# Patient Record
Sex: Female | Born: 1995 | Race: White | Hispanic: No | Marital: Married | Smoking: Former smoker
Health system: Southern US, Community
[De-identification: ages and names within clinical notes are randomized; demographics above are authoritative.]

## PROBLEM LIST (undated history)

## (undated) ENCOUNTER — Inpatient Hospital Stay (HOSPITAL_COMMUNITY): Payer: Self-pay

## (undated) DIAGNOSIS — F419 Anxiety disorder, unspecified: Secondary | ICD-10-CM

## (undated) HISTORY — PX: DILATION AND CURETTAGE OF UTERUS: SHX78

## (undated) HISTORY — PX: ADENOIDECTOMY: SUR15

## (undated) HISTORY — PX: TYMPANOSTOMY TUBE PLACEMENT: SHX32

---
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
## 2016-02-21 LAB — OB RESULTS CONSOLE GBS: STREP GROUP B AG: NEGATIVE

## 2016-03-19 ENCOUNTER — Inpatient Hospital Stay (HOSPITAL_COMMUNITY): Payer: Medicaid Other | Admitting: Anesthesiology

## 2016-03-19 ENCOUNTER — Inpatient Hospital Stay (HOSPITAL_COMMUNITY)
Admission: AD | Admit: 2016-03-19 | Discharge: 2016-03-19 | Disposition: A | Discharge status: Home / Self Care | Payer: Medicaid Other | Source: Ambulatory Visit | Attending: Obstetrics and Gynecology | Admitting: Obstetrics and Gynecology

## 2016-03-19 ENCOUNTER — Encounter (HOSPITAL_COMMUNITY): Payer: Self-pay

## 2016-03-19 ENCOUNTER — Inpatient Hospital Stay (HOSPITAL_COMMUNITY)
Admission: AD | Admit: 2016-03-19 | Discharge: 2016-03-22 | DRG: 775 | Disposition: A | Discharge status: Home / Self Care | Payer: Medicaid Other | Source: Ambulatory Visit | Attending: Obstetrics and Gynecology | Admitting: Obstetrics and Gynecology

## 2016-03-19 DIAGNOSIS — Z87891 Personal history of nicotine dependence: Secondary | ICD-10-CM

## 2016-03-19 DIAGNOSIS — K219 Gastro-esophageal reflux disease without esophagitis: Secondary | ICD-10-CM | POA: Diagnosis present

## 2016-03-19 DIAGNOSIS — Z3493 Encounter for supervision of normal pregnancy, unspecified, third trimester: Secondary | ICD-10-CM | POA: Insufficient documentation

## 2016-03-19 DIAGNOSIS — Z3483 Encounter for supervision of other normal pregnancy, third trimester: Secondary | ICD-10-CM | POA: Diagnosis present

## 2016-03-19 DIAGNOSIS — O9962 Diseases of the digestive system complicating childbirth: Secondary | ICD-10-CM | POA: Diagnosis present

## 2016-03-19 DIAGNOSIS — Z3A40 40 weeks gestation of pregnancy: Secondary | ICD-10-CM

## 2016-03-19 LAB — CBC
HCT: 33.8 % — ABNORMAL LOW (ref 36.0–46.0)
HEMOGLOBIN: 11.4 g/dL — AB (ref 12.0–15.0)
MCH: 29.5 pg (ref 26.0–34.0)
MCHC: 33.7 g/dL (ref 30.0–36.0)
MCV: 87.3 fL (ref 78.0–100.0)
PLATELETS: 136 10*3/uL — AB (ref 150–400)
RBC: 3.87 MIL/uL (ref 3.87–5.11)
RDW: 13.7 % (ref 11.5–15.5)
WBC: 14.2 10*3/uL — AB (ref 4.0–10.5)

## 2016-03-19 LAB — TYPE AND SCREEN
ABO/RH(D): O POS
Antibody Screen: NEGATIVE

## 2016-03-19 MED ORDER — OXYTOCIN BOLUS FROM INFUSION
500.0000 mL | Freq: Once | INTRAVENOUS | Status: AC
  Administered 2016-03-20: 500 mL via INTRAVENOUS

## 2016-03-19 MED ORDER — ACETAMINOPHEN 325 MG PO TABS: 650.0000 mg | ORAL_TABLET | ORAL | Status: DC | PRN

## 2016-03-19 MED ORDER — EPHEDRINE 5 MG/ML INJ
10.0000 mg | INTRAVENOUS | Status: DC | PRN
  Filled 2016-03-19: qty 4

## 2016-03-19 MED ORDER — FLEET ENEMA 7-19 GM/118ML RE ENEM: 1.0000 | ENEMA | RECTAL | Status: DC | PRN

## 2016-03-19 MED ORDER — LIDOCAINE HCL (PF) 1 % IJ SOLN
30.0000 mL | INTRAMUSCULAR | Status: DC | PRN
  Filled 2016-03-19: qty 30

## 2016-03-19 MED ORDER — LACTATED RINGERS IV SOLN: 500.0000 mL | INTRAVENOUS | Status: DC | PRN

## 2016-03-19 MED ORDER — OXYTOCIN 40 UNITS IN LACTATED RINGERS INFUSION - SIMPLE MED
2.5000 [IU]/h | INTRAVENOUS | Status: DC
  Filled 2016-03-19: qty 1000

## 2016-03-19 MED ORDER — OXYCODONE-ACETAMINOPHEN 5-325 MG PO TABS: 1.0000 | ORAL_TABLET | ORAL | Status: DC | PRN

## 2016-03-19 MED ORDER — ONDANSETRON HCL 4 MG/2ML IJ SOLN: 4.0000 mg | Freq: Four times a day (QID) | INTRAMUSCULAR | Status: DC | PRN

## 2016-03-19 MED ORDER — LACTATED RINGERS IV SOLN
500.0000 mL | Freq: Once | INTRAVENOUS | Status: AC
  Administered 2016-03-19: 500 mL via INTRAVENOUS

## 2016-03-19 MED ORDER — PHENYLEPHRINE 40 MCG/ML (10ML) SYRINGE FOR IV PUSH (FOR BLOOD PRESSURE SUPPORT)
80.0000 ug | PREFILLED_SYRINGE | INTRAVENOUS | Status: DC | PRN
Start: 2016-03-19 — End: 2016-03-20
  Filled 2016-03-19: qty 5

## 2016-03-19 MED ORDER — SOD CITRATE-CITRIC ACID 500-334 MG/5ML PO SOLN: 30.0000 mL | ORAL | Status: DC | PRN

## 2016-03-19 MED ORDER — OXYCODONE-ACETAMINOPHEN 5-325 MG PO TABS: 2.0000 | ORAL_TABLET | ORAL | Status: DC | PRN

## 2016-03-19 MED ORDER — PHENYLEPHRINE 40 MCG/ML (10ML) SYRINGE FOR IV PUSH (FOR BLOOD PRESSURE SUPPORT)
80.0000 ug | PREFILLED_SYRINGE | INTRAVENOUS | Status: DC | PRN
  Filled 2016-03-19: qty 5
  Filled 2016-03-19: qty 10

## 2016-03-19 MED ORDER — DIPHENHYDRAMINE HCL 50 MG/ML IJ SOLN
12.5000 mg | INTRAMUSCULAR | Status: DC | PRN
  Administered 2016-03-20: 12.5 mg via INTRAVENOUS
  Filled 2016-03-19: qty 1

## 2016-03-19 MED ORDER — FENTANYL 2.5 MCG/ML BUPIVACAINE 1/10 % EPIDURAL INFUSION (WH - ANES)
14.0000 mL/h | INTRAMUSCULAR | Status: DC | PRN
  Administered 2016-03-19 – 2016-03-20 (×2): 14 mL/h via EPIDURAL
  Filled 2016-03-19 (×2): qty 125

## 2016-03-19 MED ORDER — LACTATED RINGERS IV SOLN
INTRAVENOUS | Status: DC
  Administered 2016-03-19 – 2016-03-20 (×2): via INTRAVENOUS

## 2016-03-19 NOTE — Anesthesia Preprocedure Evaluation (Signed)
Anesthesia Evaluation  Patient identified by MRN, date of birth, ID band Patient awake    Reviewed: Allergy & Precautions, NPO status , Patient's Chart, lab work & pertinent test results  Airway Mallampati: II  TM Distance: >3 FB Neck ROM: Full    Dental no notable dental hx. (+) Teeth Intact   Pulmonary former smoker,    Pulmonary exam normal breath sounds clear to auscultation       Cardiovascular negative cardio ROS Normal cardiovascular exam Rhythm:Regular Rate:Normal     Neuro/Psych PSYCHIATRIC DISORDERS Anxiety negative neurological ROS     GI/Hepatic Neg liver ROS, GERD  Medicated and Controlled,  Endo/Other  negative endocrine ROS  Renal/GU negative Renal ROS  negative genitourinary   Musculoskeletal negative musculoskeletal ROS (+)   Abdominal   Peds negative pediatric ROS (+)  Hematology  (+) anemia , Thrombocytopenia-mild   Anesthesia Other Findings   Reproductive/Obstetrics (+) Pregnancy                               Lab Results  Component Value Date   WBC 14.2 (H) 03/19/2016   HGB 11.4 (L) 03/19/2016   HCT 33.8 (L) 03/19/2016   MCV 87.3 03/19/2016   PLT 136 (L) 03/19/2016    Anesthesia Physical  Anesthesia Plan  ASA: II  Anesthesia Plan: Epidural   Post-op Pain Management:    Induction: Intravenous  Airway Management Planned: Natural Airway  Additional Equipment:   Intra-op Plan:   Post-operative Plan:   Informed Consent: I have reviewed the patients History and Physical, chart, labs and discussed the procedure including the risks, benefits and alternatives for the proposed anesthesia with the patient or authorized representative who has indicated his/her understanding and acceptance.     Plan Discussed with: Anesthesiologist  Anesthesia Plan Comments:         Anesthesia Quick Evaluation

## 2016-03-19 NOTE — MAU Note (Signed)
Notified provider that patient is 3/90/-3. Provider said to admit patient to l&d.

## 2016-03-19 NOTE — MAU Note (Signed)
Contracting anywhere from 3-10 min.  No leaking, small amt of bleeding.  Was 1+cm last wk when checked.

## 2016-03-19 NOTE — Discharge Instructions (Signed)
Keep scheduled appt for prenatal care. Call the office or provider on call with further concerns or return to MAU as needed. °

## 2016-03-19 NOTE — MAU Note (Signed)
Notified provider that patient is here for a labor eval. Patient mad change from 1.5 cm to 3cm. Provider siad to recheck patient in another hour.

## 2016-03-19 NOTE — MAU Note (Signed)
Pt presents complaining of contractions. They have continued since being seen this am in MAU. Have not gotten worse but haven't stopped. Denies leaking. Reports some spotting when she wipes. Reports good fetal movement.

## 2016-03-20 ENCOUNTER — Encounter (HOSPITAL_COMMUNITY): Payer: Self-pay | Admitting: *Deleted

## 2016-03-20 LAB — RPR: RPR: NONREACTIVE

## 2016-03-20 MED ORDER — DIPHENHYDRAMINE HCL 25 MG PO CAPS: 25.0000 mg | ORAL_CAPSULE | Freq: Four times a day (QID) | ORAL | Status: DC | PRN

## 2016-03-20 MED ORDER — BENZOCAINE-MENTHOL 20-0.5 % EX AERO
1.0000 "application " | INHALATION_SPRAY | CUTANEOUS | Status: DC | PRN
  Administered 2016-03-20: 1 via TOPICAL
  Filled 2016-03-20: qty 56

## 2016-03-20 MED ORDER — PRENATAL MULTIVITAMIN CH
1.0000 | ORAL_TABLET | Freq: Every day | ORAL | Status: DC
  Administered 2016-03-21: 1 via ORAL
  Filled 2016-03-20: qty 1

## 2016-03-20 MED ORDER — ONDANSETRON HCL 4 MG PO TABS: 4.0000 mg | ORAL_TABLET | ORAL | Status: DC | PRN

## 2016-03-20 MED ORDER — METHYLERGONOVINE MALEATE 0.2 MG/ML IJ SOLN: 0.2000 mg | INTRAMUSCULAR | Status: DC | PRN

## 2016-03-20 MED ORDER — ONDANSETRON HCL 4 MG/2ML IJ SOLN: 4.0000 mg | INTRAMUSCULAR | Status: DC | PRN

## 2016-03-20 MED ORDER — MEASLES, MUMPS & RUBELLA VAC ~~LOC~~ INJ
0.5000 mL | INJECTION | Freq: Once | SUBCUTANEOUS | Status: DC
  Filled 2016-03-20: qty 0.5

## 2016-03-20 MED ORDER — SENNOSIDES-DOCUSATE SODIUM 8.6-50 MG PO TABS
2.0000 | ORAL_TABLET | ORAL | Status: DC
  Administered 2016-03-20 – 2016-03-21 (×2): 2 via ORAL
  Filled 2016-03-20 (×3): qty 2

## 2016-03-20 MED ORDER — COCONUT OIL OIL: 1.0000 "application " | TOPICAL_OIL | Status: DC | PRN

## 2016-03-20 MED ORDER — DIBUCAINE 1 % RE OINT: 1.0000 "application " | TOPICAL_OINTMENT | RECTAL | Status: DC | PRN

## 2016-03-20 MED ORDER — TETANUS-DIPHTH-ACELL PERTUSSIS 5-2.5-18.5 LF-MCG/0.5 IM SUSP: 0.5000 mL | Freq: Once | INTRAMUSCULAR | Status: DC

## 2016-03-20 MED ORDER — MAGNESIUM HYDROXIDE 400 MG/5ML PO SUSP: 30.0000 mL | ORAL | Status: DC | PRN

## 2016-03-20 MED ORDER — IBUPROFEN 600 MG PO TABS
600.0000 mg | ORAL_TABLET | Freq: Four times a day (QID) | ORAL | Status: DC
  Administered 2016-03-20 – 2016-03-22 (×7): 600 mg via ORAL
  Filled 2016-03-20 (×8): qty 1

## 2016-03-20 MED ORDER — ZOLPIDEM TARTRATE 5 MG PO TABS: 5.0000 mg | ORAL_TABLET | Freq: Every evening | ORAL | Status: DC | PRN

## 2016-03-20 MED ORDER — SIMETHICONE 80 MG PO CHEW
80.0000 mg | CHEWABLE_TABLET | ORAL | Status: DC | PRN
Start: 2016-03-20 — End: 2016-03-22

## 2016-03-20 MED ORDER — WITCH HAZEL-GLYCERIN EX PADS: 1.0000 "application " | MEDICATED_PAD | CUTANEOUS | Status: DC | PRN

## 2016-03-20 MED ORDER — LIDOCAINE HCL (PF) 1 % IJ SOLN
INTRAMUSCULAR | Status: DC | PRN
  Administered 2016-03-19 (×2): 4 mL via EPIDURAL

## 2016-03-20 MED ORDER — OXYCODONE HCL 5 MG PO TABS
5.0000 mg | ORAL_TABLET | ORAL | Status: DC | PRN
  Administered 2016-03-20: 5 mg via ORAL
  Filled 2016-03-20: qty 1

## 2016-03-20 MED ORDER — METHYLERGONOVINE MALEATE 0.2 MG PO TABS: 0.2000 mg | ORAL_TABLET | ORAL | Status: DC | PRN

## 2016-03-20 MED ORDER — OXYCODONE HCL 5 MG PO TABS: 10.0000 mg | ORAL_TABLET | ORAL | Status: DC | PRN

## 2016-03-20 MED ORDER — ACETAMINOPHEN 325 MG PO TABS: 650.0000 mg | ORAL_TABLET | ORAL | Status: DC | PRN

## 2016-03-20 NOTE — Progress Notes (Signed)
MOB was referred for history of depression/anxiety. * Referral screened out by Clinical Social Worker because none of the following criteria appear to apply: ~ History of anxiety/depression during this pregnancy, or of post-partum depression. ~ Diagnosis of anxiety and/or depression within last 3 years OR * MOB's symptoms currently being treated with medication and/or therapy. Please contact the Clinical Social Worker if needs arise, or if MOB requests.   

## 2016-03-20 NOTE — H&P (Signed)
Regina Schwartz is a 20 y.o. female G2P0010 at 72 week, presenting for 2nd time today with ctx.  +FM, no LOF, no VB, ctx - increasing in intensity/frequency.  Relatively uncomplicated PNC.  Dated by early [place].    OB History    Gravida Para Term Preterm AB Living   2       1     SAB TAB Ectopic Multiple Live Births   1            G1 SAB - D&C G2 present No abn pap No STD  Past Medical History:  Diagnosis Date  . Anxiety    Past Surgical History:  Procedure Laterality Date  . ADENOIDECTOMY  age 44  . DILATION AND EVACUATION N/A 04/19/2015   Procedure: DILATATION AND EVACUATION;  Surgeon: Regina Areola, DO;  Location: [facility];  Service: Gynecology;  Laterality: N/A;  . TYMPANOSTOMY TUBE PLACEMENT     Family History: family history includes Asthma in her brother; Drug abuse in her cousin; Migraines in her mother; Pancreatitis in her father; Seizures in her father. Social History:  reports that she quit smoking about 13 months ago. Her smoking use included Cigarettes. She has a 2.00 pack-year smoking history. She has never used smokeless tobacco. She reports that she does not drink alcohol or use drugs.   Meds PNV, prilosec All NKDA    Maternal Diabetes: No Genetic Screening: Normal Maternal Ultrasounds/Referrals: Normal Fetal Ultrasounds or other Referrals:  None Maternal Substance Abuse:  No Significant Maternal Medications:  None Significant Maternal Lab Results:  Lab values include: Group B Strep negative Other Comments:  None  Review of Systems  Constitutional: Negative.   HENT: Negative.   Eyes: Negative.   Respiratory: Negative.   Cardiovascular: Negative.   Gastrointestinal: Negative.   Genitourinary: Negative.   Musculoskeletal: Positive for back pain.  Skin: Negative.   Neurological: Negative.   Psychiatric/Behavioral: Negative.    Maternal Medical History:  Reason for admission: Contractions.   Contractions: Frequency: regular.   Perceived severity  is strong.    Fetal activity: Perceived fetal activity is normal.    Prenatal Complications - Diabetes: none.    Dilation: 3.5 Effacement (%): 90 Station: -2 Exam by:: Regina Popper, RN Blood pressure 123/71, pulse (!) 103, temperature 98.1 F (36.7 C), temperature source Oral, resp. rate 16, height 5\' 4"  (1.626 m), weight 62.6 kg (138 lb), last menstrual period 06/09/2015, SpO2 100 %, unknown if currently breastfeeding. Maternal Exam:  Uterine Assessment: Contraction frequency is regular.   Abdomen: Fundal height is appropriate for gestational.   Estimated fetal weight is 6-7#.   Fetal presentation: vertex  Introitus: Normal vulva. Normal vagina.  Cervix: Cervix evaluated by digital exam.     Physical Exam  Constitutional: She is oriented to person, place, and time. She appears well-developed and well-nourished.  HENT:  Head: Normocephalic and atraumatic.  Cardiovascular: Normal rate and regular rhythm.   Respiratory: Effort normal and breath sounds normal. No respiratory distress. She has no wheezes.  GI: Soft. Bowel sounds are normal. She exhibits no distension. There is no tenderness.  Musculoskeletal: Normal range of motion.  Neurological: She is alert and oriented to person, place, and time.  Skin: Skin is warm and dry.  Psychiatric: She has a normal mood and affect. Her behavior is normal.    Prenatal labs: ABO, Rh: --/--/O POS (10/18 2250) Antibody: NEG (10/18 2250) Rubella: Immune (04/10 0000) RPR: Nonreactive (04/10 0000)  HBsAg: Negative (04/10 0000)  HIV: Non-reactive (04/10 0000)  GBS: Negative (09/21 0000)   Dated by early US - EDC 03/20/2016 US cwd, ant plac, nl anat, female  Hgb 13.0/Plt 158/GC neg/Chl neg/Varicella immune/First trimester screen WNL/glucola 113/essential -panel neg - CF neg/ SMA neg/ Fragile X neg  Tdap 01/09/16  Assessment/Plan: 20yo G2P0010 at 40 week in early labor Augment with pitocin and AROM Epidural for pain Expect  SVD   Regina Schwartz, Regina Schwartz 03/20/2016, 1:46 AM

## 2016-03-20 NOTE — Anesthesia Procedure Notes (Signed)
Epidural Patient location during procedure: OB Start time: 03/19/2016 11:50 PM  Staffing Anesthesiologist: Mal AmabileFOSTER, Jaycelyn Orrison Performed: anesthesiologist   Preanesthetic Checklist Completed: patient identified, site marked, surgical consent, pre-op evaluation, timeout performed, IV checked, risks and benefits discussed and monitors and equipment checked  Epidural Patient position: sitting Prep: site prepped and draped and DuraPrep Patient monitoring: continuous pulse ox and blood pressure Approach: midline Location: L3-L4 Injection technique: LOR air  Needle:  Needle type: Tuohy  Needle gauge: 17 G Needle length: 9 cm and 9 Needle insertion depth: 4 cm Catheter type: closed end flexible Catheter size: 19 Gauge Catheter at skin depth: 9 cm Test dose: negative and Other  Assessment Events: blood not aspirated, injection not painful, no injection resistance, negative IV test and no paresthesia  Additional Notes Patient identified. Risks and benefits discussed including failed block, incomplete  Pain control, post dural puncture headache, nerve damage, paralysis, blood pressure Changes, nausea, vomiting, reactions to medications-both toxic and allergic and post Partum back pain. All questions were answered. Patient expressed understanding and wished to proceed. Sterile technique was used throughout procedure. Epidural site was Dressed with sterile barrier dressing. No paresthesias, signs of intravascular injection Or signs of intrathecal spread were encountered.  Patient was more comfortable after the epidural was dosed. Please see RN's note for documentation of vital signs and FHR which are stable.

## 2016-03-20 NOTE — Anesthesia Postprocedure Evaluation (Signed)
Anesthesia Post Note  Patient: Regina Schwartz  Procedure(s) Performed: * No procedures listed *  Patient location during evaluation: Mother Baby Anesthesia Type: Epidural Level of consciousness: awake and alert Pain management: satisfactory to patient Vital Signs Assessment: post-procedure vital signs reviewed and stable Respiratory status: respiratory function stable Cardiovascular status: stable Postop Assessment: no headache, no backache, epidural receding, patient able to bend at knees, no signs of nausea or vomiting and adequate PO intake Anesthetic complications: no     Last Vitals:  Vitals:   03/20/16 1330 03/20/16 1430  BP: 116/66 115/65  Pulse: 100 98  Resp: 20 19  Temp: 37 C 36.9 C    Last Pain:  Vitals:   03/20/16 1430  TempSrc: Oral  PainSc: 4    Pain Goal:                 [name]

## 2016-03-20 NOTE — Anesthesia Rounding Note (Signed)
  CRNA Epidural Rounding Note  Patient: Regina Schwartz, 20 y.o., female  Patient's current pain level: 2  Agreed upon pain management level: 4  Epidural intervention: No   Comments: patient comfortable [facility] 03/20/2016

## 2016-03-20 NOTE — Progress Notes (Signed)
Comfortable with epidural Afeb, VSS FHT- 150s, mod variability, non-repetitive variable decels, Cat II, ctx q 3 min VE-ant lip/C/+1, vtx Continue to monitor progress, anticipate SVD

## 2016-03-21 NOTE — Progress Notes (Signed)
Patient ID: Rutherford GuysHayley R Harts, female   DOB: 02/21/1996, 20 y.o.   MRN: [id number] PPD#1 Pt doing well. Pain controlled with ibuprofen. Lochia mild. No fever or chills. Tolerating Po. Voiding and ambulating well. Bonding well with baby; having some trouble getting baby to take to bottle.  VSS ABD - soft, ND, NTTP EXT- no homans  A/P: PPD#1 s/p svd - stable        Routine pp care        Discharge to home tomorrow

## 2016-03-22 MED ORDER — IBUPROFEN 600 MG PO TABS: 600.0000 mg | ORAL_TABLET | Freq: Four times a day (QID) | ORAL | 1 refills | Status: DC | PRN

## 2016-03-22 NOTE — Discharge Summary (Signed)
OB Discharge Summary     Patient Name: Regina Schwartz DOB: 11/24/1995 MRN: [id number]  Date of admission: 03/19/2016 Delivering MD: Jackelyn KnifeMEISINGER, TODD   Date of discharge: 03/22/2016  Admitting diagnosis: 39.6wks ctx Intrauterine pregnancy: 6079w0d     Secondary diagnosis:  Active Problems:   Indication for care in labor or delivery   SVD (spontaneous vaginal delivery)  Additional problems: none     Discharge diagnosis: Term Pregnancy Delivered                                                                                                Post partum procedures:none  Augmentation: AROM and Pitocin  Complications: None  Hospital course:  Onset of Labor With Vaginal Delivery     20 y.o. yo G2P1011 at 6279w0d was admitted in Latent Labor on 03/19/2016. Patient had an uncomplicated labor course as follows:  Membrane Rupture Time/Date: 12:30 AM ,03/20/2016   Intrapartum Procedures: Episiotomy: None [1]                                         Lacerations:  2nd degree [3]  Patient had a delivery of a Viable infant. 03/20/2016  Information for the patient's newborn:  Despina AriasSpencer, Girl Morrissa [[id number]][[id number]]  Delivery Method: Vag-Vacuum    Pateint had an uncomplicated postpartum course.  She is ambulating, tolerating a regular diet, passing flatus, and urinating well. Patient is discharged home in stable condition on 03/22/16.    Physical exam Vitals:   03/21/16 0505 03/21/16 1811 03/21/16 1930 03/22/16 0650  BP: 110/74 117/76 117/67 110/79  Pulse: 86 (!) 115 83 (!) 108  Resp: 18 18 18 20   Temp: 98.1 F (36.7 C)  97.8 F (36.6 C) 98 F (36.7 C)  TempSrc: Oral  Oral Oral  SpO2:   100%   Weight:      Height:       General: alert, cooperative and no distress Lochia: appropriate Uterine Fundus: firm Incision: N/A DVT Evaluation: No evidence of DVT seen on physical exam. Negative Homan's sign. Labs: Lab Results  Component Value Date   WBC 14.2 (H) 03/19/2016   HGB 11.4 (L)  03/19/2016   HCT 33.8 (L) 03/19/2016   MCV 87.3 03/19/2016   PLT 136 (L) 03/19/2016   CMP Latest Ref Rng & Units 04/02/2012  Glucose 70 - 99 mg/dL 82  BUN 6 - 23 mg/dL 15  Creatinine 6.290.10 - 5.281.20 mg/dL 4.130.71  Sodium 244135 - 010145 mEq/L 139  Potassium 3.5 - 5.3 mEq/L 3.8  Chloride 96 - 112 mEq/L 106  CO2 19 - 32 mEq/L 24  Calcium 8.4 - 10.5 mg/dL 9.8  Total Protein 6.0 - 8.3 g/dL 7.0  Total Bilirubin 0.3 - 1.2 mg/dL 0.6  Alkaline Phos 50 - 162 U/L 83  AST 0 - 37 U/L 17  ALT 0 - 35 U/L <8    Discharge instruction: per After Visit Summary and "Baby and Me Booklet".  After visit meds:    Medication List  TAKE these medications   ibuprofen 600 MG tablet Commonly known as:  ADVIL,MOTRIN Take 1 tablet (600 mg total) by mouth every 6 (six) hours as needed for headache, mild pain, moderate pain or cramping.   omeprazole 20 MG capsule Commonly known as:  PRILOSEC Take 20 mg by mouth daily as needed (for acid reflux).       Diet: routine diet  Activity: Advance as tolerated. Pelvic rest for 6 weeks.   Outpatient follow up:6 weeks Follow up Appt:No future appointments. Follow up Visit:No Follow-up on file.  Postpartum contraception: Undecided  Newborn Data: Live born female  Birth Weight: 6 lb 1.7 oz (2770 g) APGAR: 8, 9  Baby Feeding: Bottle Disposition:home with mother   03/22/2016 Sharol Given Mckay Brandt, DO

## 2016-03-22 NOTE — Discharge Instructions (Signed)
Nothing in vagina for 6 weeks.  No sex, tampons, and douching.  Other instructions as in [facility] Discharge Booklet. °

## 2016-03-22 NOTE — Progress Notes (Signed)
Patient ID: Regina Schwartz, female   DOB: 05/26/1996, 20 y.o.   MRN: [id number]  Pt doing well. Baby finally doing well bottlefeeding after given preemie nipple. No fever, chills or headaches. Lochia mild. Ready for discharge to home today VSS ABD-FF EXT- no homans  A/P: PPD#2 s/p svd - stable         Reviewed d/c instructions         F/u in 6weeks

## 2018-10-20 ENCOUNTER — Other Ambulatory Visit: Payer: Self-pay | Admitting: Nurse Practitioner

## 2018-10-20 DIAGNOSIS — R112 Nausea with vomiting, unspecified: Secondary | ICD-10-CM

## 2018-10-20 DIAGNOSIS — R1013 Epigastric pain: Secondary | ICD-10-CM

## 2018-10-27 ENCOUNTER — Ambulatory Visit
Admission: RE | Admit: 2018-10-27 | Discharge: 2018-10-27 | Disposition: A | Discharge status: Home / Self Care | Payer: Medicaid Other | Source: Ambulatory Visit | Attending: Nurse Practitioner | Admitting: Nurse Practitioner

## 2018-10-27 DIAGNOSIS — R112 Nausea with vomiting, unspecified: Secondary | ICD-10-CM

## 2018-10-27 DIAGNOSIS — R1013 Epigastric pain: Secondary | ICD-10-CM

## 2019-07-19 ENCOUNTER — Other Ambulatory Visit: Payer: Self-pay | Admitting: Family Medicine

## 2019-07-19 DIAGNOSIS — N6320 Unspecified lump in the left breast, unspecified quadrant: Secondary | ICD-10-CM

## 2019-08-08 ENCOUNTER — Other Ambulatory Visit: Payer: Self-pay

## 2019-08-08 ENCOUNTER — Other Ambulatory Visit: Payer: Self-pay | Admitting: Family Medicine

## 2019-08-08 ENCOUNTER — Ambulatory Visit
Admission: RE | Admit: 2019-08-08 | Discharge: 2019-08-08 | Disposition: A | Discharge status: Home / Self Care | Payer: Medicaid Other | Source: Ambulatory Visit | Attending: Family Medicine | Admitting: Family Medicine

## 2019-08-08 DIAGNOSIS — N6320 Unspecified lump in the left breast, unspecified quadrant: Secondary | ICD-10-CM

## 2020-02-13 ENCOUNTER — Other Ambulatory Visit: Payer: Self-pay | Admitting: Family Medicine

## 2020-02-13 ENCOUNTER — Ambulatory Visit
Admission: RE | Admit: 2020-02-13 | Discharge: 2020-02-13 | Disposition: A | Discharge status: Home / Self Care | Payer: Medicaid Other | Source: Ambulatory Visit | Attending: Family Medicine | Admitting: Family Medicine

## 2020-02-13 DIAGNOSIS — N6320 Unspecified lump in the left breast, unspecified quadrant: Secondary | ICD-10-CM

## 2020-03-23 ENCOUNTER — Ambulatory Visit
Admission: EM | Admit: 2020-03-23 | Discharge: 2020-03-23 | Disposition: A | Discharge status: Home / Self Care | Payer: Medicaid Other | Attending: Emergency Medicine | Admitting: Emergency Medicine

## 2020-03-23 DIAGNOSIS — M7750 Other enthesopathy of unspecified foot: Secondary | ICD-10-CM

## 2020-03-23 MED ORDER — NAPROXEN 500 MG PO TABS: 500.0000 mg | ORAL_TABLET | Freq: Two times a day (BID) | ORAL | 0 refills | Status: DC

## 2020-03-23 NOTE — ED Provider Notes (Signed)
[facility]    CSN: [id number] Arrival date & time: 03/23/20  1533      History   Chief Complaint Chief Complaint  Patient presents with  . Foot Pain    HPI Regina Schwartz is a 24 y.o. female  Presenting for left medial foot/heel pain x1 week.  States it is worse in the morning, or upon standing after sitting for long time.  States it will radiate up to her calf sometimes.  Denies injury, change in activity level, footwear.  Has tried 400 mg ibuprofen once daily without relief.  Past Medical History:  Diagnosis Date  . Anxiety     Patient Active Problem List   Diagnosis Date Noted  . SVD (spontaneous vaginal delivery) 03/20/2016  . Indication for care in labor or delivery 03/19/2016  . Abortion, missed 04/19/2015  . Malnourished (HCC) 03/03/2014  . Abnormal weight loss 03/03/2014  . Abdominal wall mass 03/03/2014  . Anxiety disorder 10/02/2011  . Dysmenorrhea 11/15/2010  . Menometrorrhagia 11/15/2010  . General counseling and advice on female contraception 11/15/2010  . ALLERGIC RHINITIS 03/15/2008    Past Surgical History:  Procedure Laterality Date  . ADENOIDECTOMY  age 61  . DILATION AND EVACUATION N/A 04/19/2015   Procedure: DILATATION AND EVACUATION;  Surgeon: Edwinna Areola, DO;  Location: [facility];  Service: Gynecology;  Laterality: N/A;  . TYMPANOSTOMY TUBE PLACEMENT      OB History    Gravida  2   Para  1   Term  1   Preterm      AB  1   Living  1     SAB  1   TAB      Ectopic      Multiple  0   Live Births  1            Home Medications    Prior to Admission medications   Medication Sig Start Date End Date Taking? Authorizing Provider  naproxen (NAPROSYN) 500 MG tablet Take 1 tablet (500 mg total) by mouth 2 (two) times daily. 03/23/20   Hall-Potvin, [place], PA-C    Family History Family History  Problem Relation Age of Onset  . Migraines Mother   . Seizures Father   . Pancreatitis Father   .  Asthma Brother   . Drug abuse Cousin     Social History Social History   Tobacco Use  . Smoking status: Former Smoker    Packs/day: 0.50    Years: 4.00    Pack years: 2.00    Types: Cigarettes    Quit date: 02/14/2015    Years since quitting: 5.1  . Smokeless tobacco: Never Used  Substance Use Topics  . Alcohol use: No  . Drug use: No     Allergies   Patient has no known allergies.   Review of Systems As per HPI   Physical Exam Triage Vital Signs ED Triage Vitals  Enc Vitals Group     BP      Pulse      Resp      Temp      Temp src      SpO2      Weight      Height      Head Circumference      Peak Flow      Pain Score      Pain Loc      Pain Edu?      Excl. in GC?  No data found.  Updated Vital Signs BP 111/73 (BP Location: Left Arm)   Pulse 73   Temp 98.4 F (36.9 C) (Oral)   Resp 20   LMP 03/02/2020   SpO2 96%   Breastfeeding No   Visual Acuity Right Eye Distance:   Left Eye Distance:   Bilateral Distance:    Right Eye Near:   Left Eye Near:    Bilateral Near:     Physical Exam Constitutional:      General: She is not in acute distress. HENT:     Head: Normocephalic and atraumatic.  Eyes:     General: No scleral icterus.    Pupils: Pupils are equal, round, and reactive to light.  Cardiovascular:     Rate and Rhythm: Normal rate.  Pulmonary:     Effort: Pulmonary effort is normal.  Musculoskeletal:        General: Tenderness present. No swelling. Normal range of motion.     Right lower leg: No edema.     Left lower leg: No edema.     Comments: Foot: No plantar tenderness.  Does have posterior tibialis tendon pain.  No Achilles tendon pain.  Negative Thompson test.  No medial or lateral malleoli tenderness.  NVI  Skin:    Coloration: Skin is not jaundiced or pale.     Findings: No bruising.  Neurological:     General: No focal deficit present.     Mental Status: She is alert and oriented to person, place, and time.       UC Treatments / Results  Labs (all labs ordered are listed, but only abnormal results are displayed) Labs Reviewed - No data to display  EKG   Radiology No results found.  Procedures Procedures (including critical care time)  Medications Ordered in UC Medications - No data to display  Initial Impression / Assessment and Plan / UC Course  I have reviewed the triage vital signs and the nursing notes.  Pertinent labs & imaging results that were available during my care of the patient were reviewed by me and considered in my medical decision making (see chart for details).     Reviewed supportive care of tendinitis as below.  Provided orthopedic contact information if needed.  Return precautions discussed, pt verbalized understanding and is agreeable to plan. Final Clinical Impressions(s) / UC Diagnoses   Final diagnoses:  Tendonitis of foot     Discharge Instructions     RICE: rest, ice, compression, elevation as needed for pain.    Pain medication:  500 mg Naprosyn/Aleve (naproxen) every 12 hours with food:  AVOID other NSAIDs while taking this (may have Tylenol).  Important to follow up with specialist(s) below for further evaluation/management if your symptoms persist or worsen.    ED Prescriptions    Medication Sig Dispense Auth. Provider   naproxen (NAPROSYN) 500 MG tablet Take 1 tablet (500 mg total) by mouth 2 (two) times daily. 30 tablet Hall-Potvin, [place], PA-C     PDMP not reviewed this encounter.   Hall-Potvin, [place], [place] 03/23/20 1655

## 2020-03-23 NOTE — ED Triage Notes (Signed)
Pt c/o pain to lt heel of foot x1wk. States now the pain is shooting up to lt calf.

## 2020-03-23 NOTE — Discharge Instructions (Addendum)
RICE: rest, ice, compression, elevation as needed for pain.    Pain medication:  500 mg Naprosyn/Aleve (naproxen) every 12 hours with food:  AVOID other NSAIDs while taking this (may have Tylenol).  Important to follow up with specialist(s) below for further evaluation/management if your symptoms persist or worsen. 

## 2020-06-02 NOTE — L&D Delivery Note (Signed)
Delivery Note Labor onset: 04/22/2021  Labor Onset Time: 0500 Complete dilation at 3:22 PM  Onset of pushing at 3:22 PM FHR second stage Cat 1 Analgesia/Anesthesia intrapartum: epidural  Guided pushing with maternal urge. Delivery of a viable female at 49. Fetal head delivered in ROA position.  Nuchal cord: loose x1.  Infant placed on maternal abd, dried, and tactile stim.  Cord double clamped after 2 minutes and cut by FOB.  Dr. Normand Sloop and RN present for birth.  Cord blood sample collected: yes Arterial cord blood sample collected: no  Placenta delivered Schultz, intact, with 3 VC.  Placenta to labor and delivery. Uterine tone firm, bleeding stable  2nd degree laceration identified.  Anesthesia: epidural Repair: 3-0 vycryl repaired in usual fashion QBL/EBL (mL): 200 Complications: none APGAR: APGAR (1 MIN): 9   APGAR (5 MINS): 9   APGAR (10 MINS):   Mom to postpartum.  Baby to Couplet care / Skin to Skin.  Oley Balm MSN, CNM 04/22/2021, 5:04 PM

## 2020-08-14 ENCOUNTER — Other Ambulatory Visit: Payer: Medicaid Other

## 2020-10-06 IMAGING — US US BREAST*L* LIMITED INC AXILLA
1 series · 7 of 7 positions shown · non-contrast
Comparison: None.

CLINICAL DATA: 23-year-old presenting with a palpable lump in UPPER
OUTER QUADRANT of the LEFT breast. She states that the lump is
mobile and is intermittently tender.

EXAM:
ULTRASOUND OF THE LEFT BREAST

[Series 1: us breast*left* limited inc axilla · 0.05mm/px · 7 of 7 slices shown]
[im 1/7]
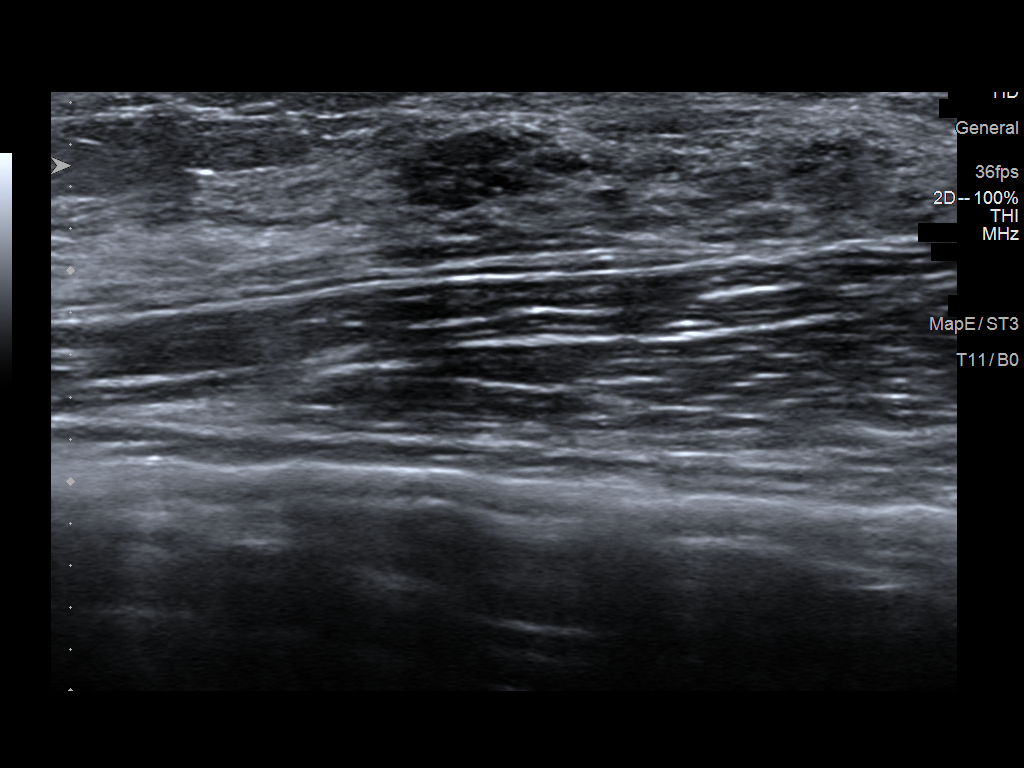
[im 2/7]
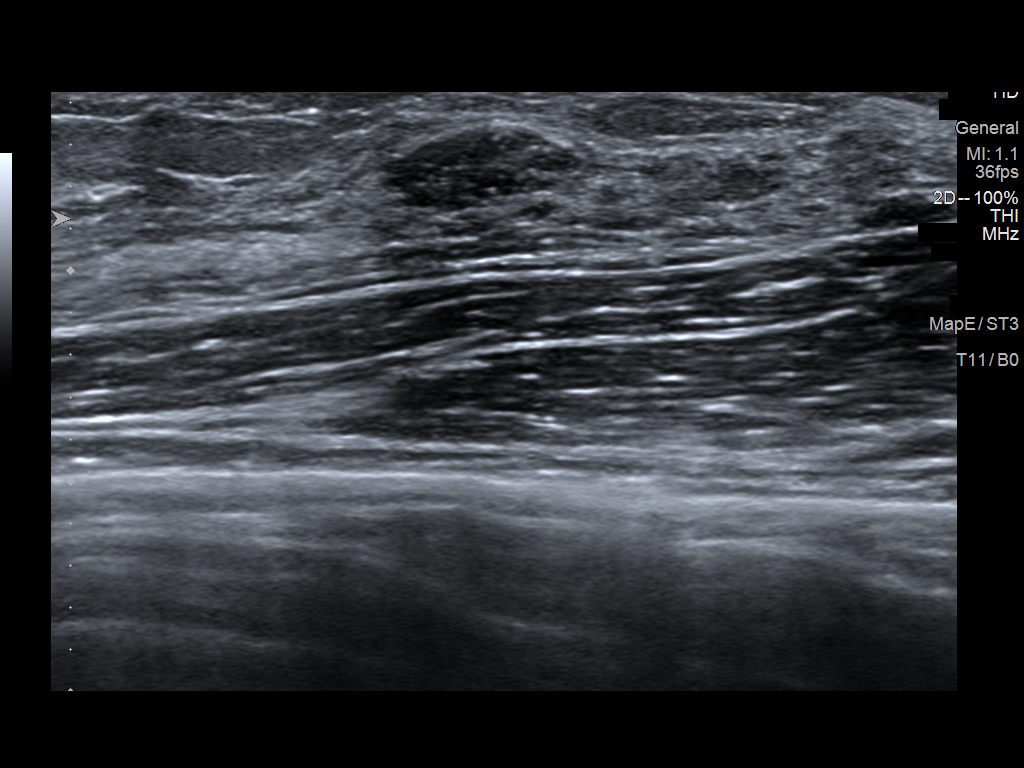
[im 3/7]
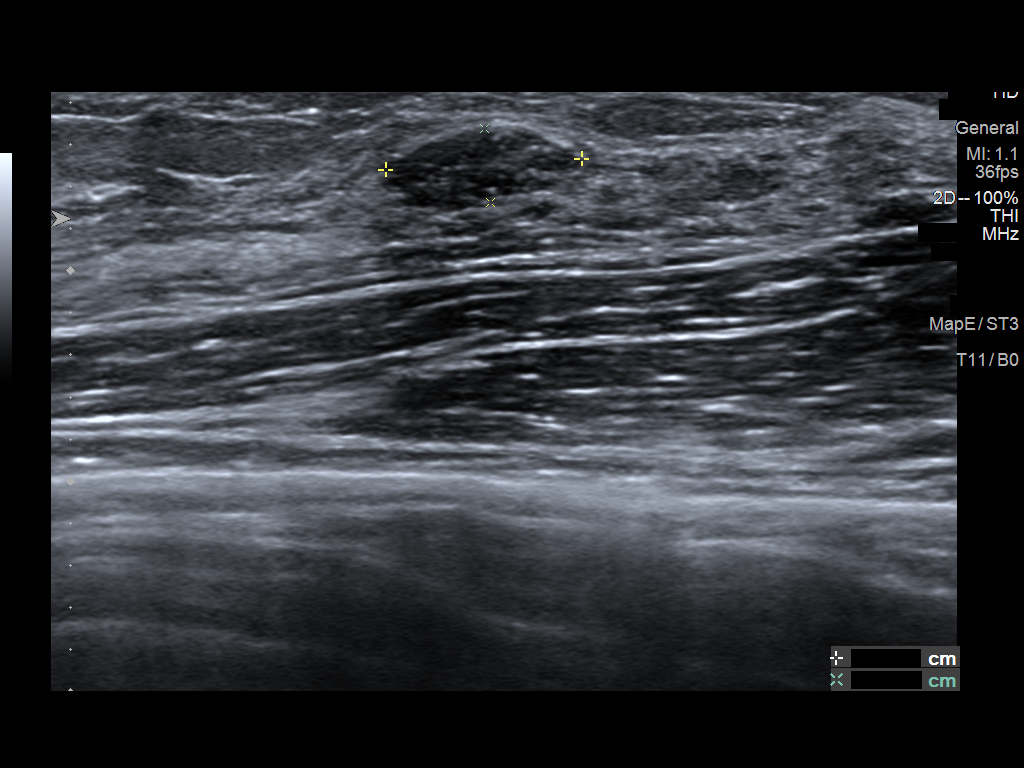
[im 4/7]
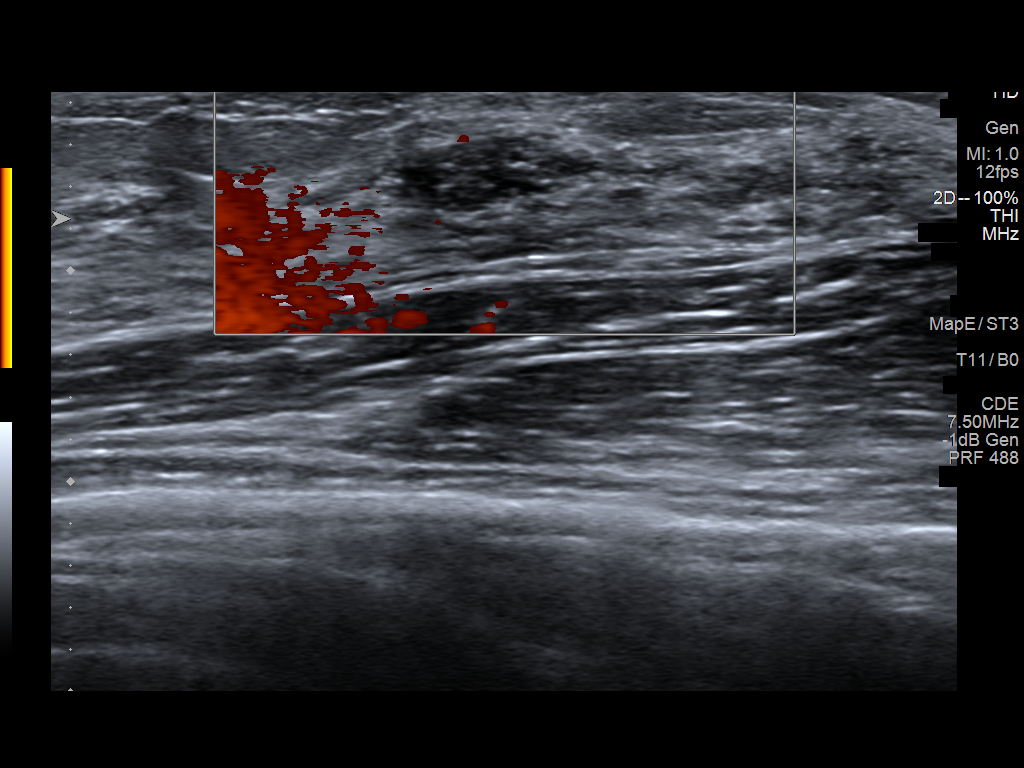
[im 5/7]
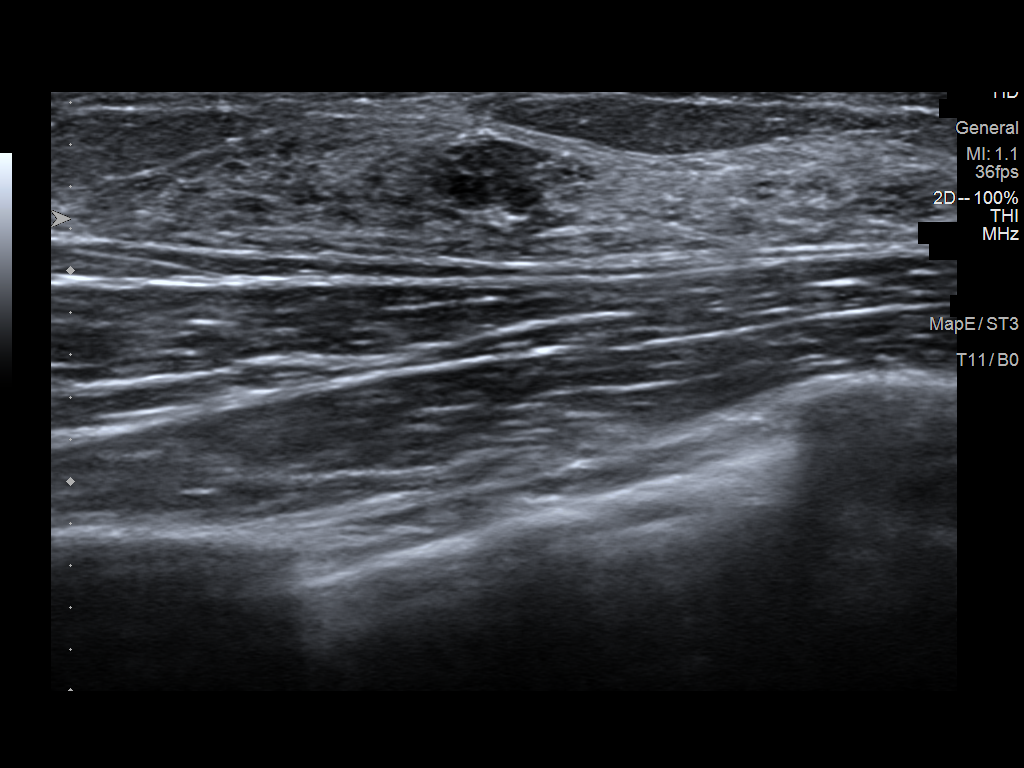
[im 6/7]
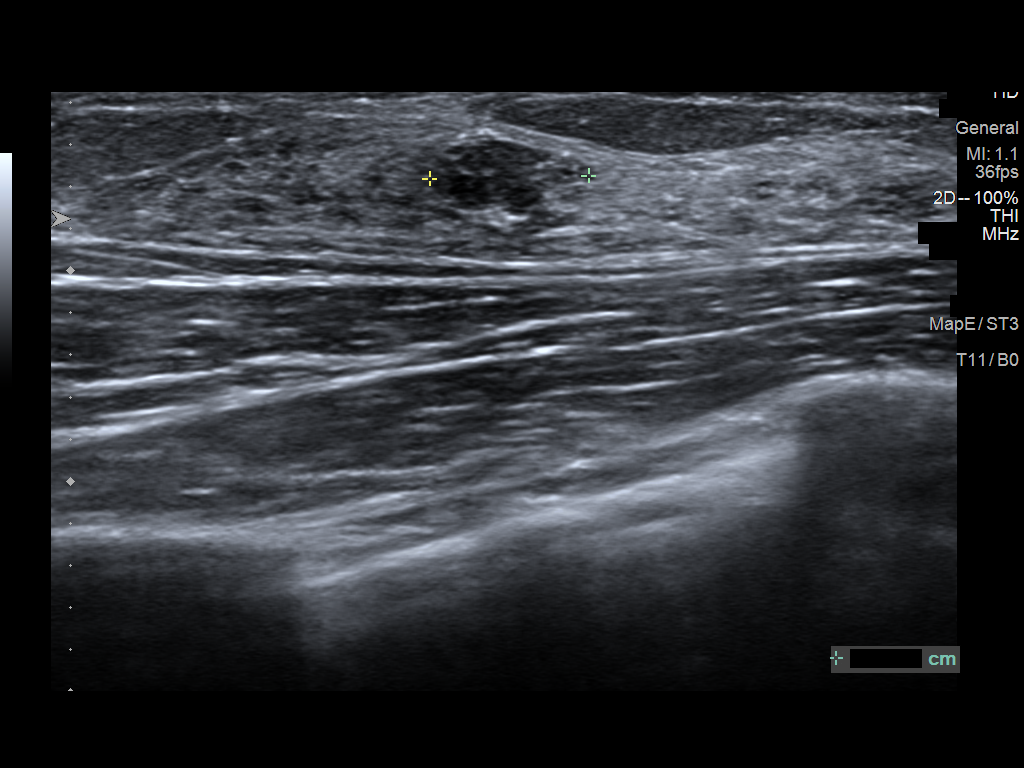
[im 7/7]
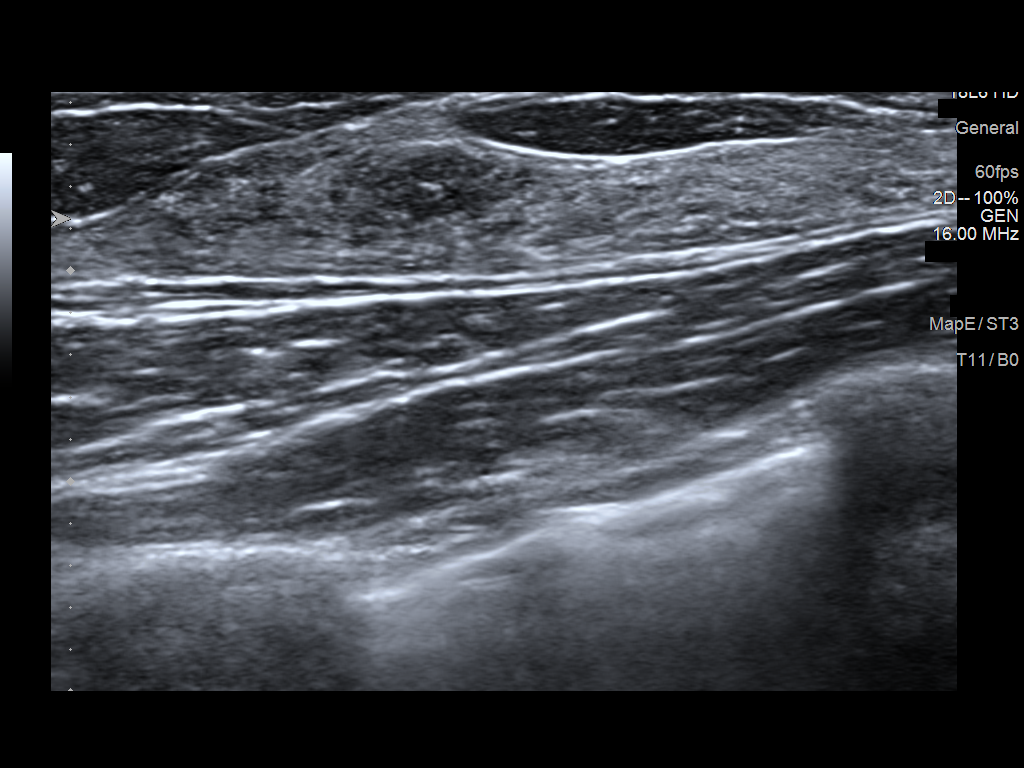

[7 of 7 positions shown; findings below may reference images not displayed]

FINDINGS: On correlative physical exam, there is a freely mobile palpable
approximate 1 cm mass in the UPPER OUTER LEFT breast corresponding
to what the patient is feeling

Targeted LEFT breast ultrasound is performed, showing an oval
circumscribed parallel hypoechoic mass at the 12:30 o'clock position
approximately 4 cm from nipple measuring approximately 4 x 9 x 8 mm,
demonstrating no posterior characteristics and demonstrating no
internal power Doppler flow, corresponding the area of palpable
concern
IMPRESSION: Likely benign 9 mm mass in the UPPER OUTER LEFT breast, possibly a
fibroadenoma, accounting for the palpable concern.

RECOMMENDATION:
We discussed management options for the likely benign fibroadenoma
including excision, ultrasound-guided core biopsy, and short term
interval follow-up. Follow-up ultrasound is recommended at 6, 12 and
24 months to assess stability. The patient agrees with this plan.

I have discussed the findings and recommendations with the patient.
If applicable, a reminder letter will be sent to the patient
regarding the next appointment.

BI-RADS CATEGORY  3: Probably benign.

## 2020-10-16 ENCOUNTER — Inpatient Hospital Stay (HOSPITAL_COMMUNITY)
Admission: AD | Admit: 2020-10-16 | Discharge: 2020-10-16 | Disposition: A | Discharge status: Home / Self Care | Payer: Medicaid Other | Attending: Obstetrics & Gynecology | Admitting: Obstetrics & Gynecology

## 2020-10-16 ENCOUNTER — Encounter (HOSPITAL_COMMUNITY): Payer: Self-pay | Admitting: Obstetrics & Gynecology

## 2020-10-16 ENCOUNTER — Other Ambulatory Visit: Payer: Self-pay

## 2020-10-16 DIAGNOSIS — Z87891 Personal history of nicotine dependence: Secondary | ICD-10-CM | POA: Insufficient documentation

## 2020-10-16 DIAGNOSIS — O26891 Other specified pregnancy related conditions, first trimester: Secondary | ICD-10-CM | POA: Diagnosis present

## 2020-10-16 DIAGNOSIS — Z3A13 13 weeks gestation of pregnancy: Secondary | ICD-10-CM | POA: Insufficient documentation

## 2020-10-16 DIAGNOSIS — R109 Unspecified abdominal pain: Secondary | ICD-10-CM | POA: Insufficient documentation

## 2020-10-16 LAB — URINALYSIS, ROUTINE W REFLEX MICROSCOPIC
Bilirubin Urine: NEGATIVE
Glucose, UA: NEGATIVE mg/dL
Hgb urine dipstick: NEGATIVE
Ketones, ur: 80 mg/dL — AB
Leukocytes,Ua: NEGATIVE
Nitrite: NEGATIVE
Protein, ur: NEGATIVE mg/dL
Specific Gravity, Urine: 1.019 (ref 1.005–1.030)
pH: 5 (ref 5.0–8.0)

## 2020-10-16 MED ORDER — ACETAMINOPHEN 325 MG PO TABS: 650.0000 mg | ORAL_TABLET | Freq: Four times a day (QID) | ORAL | 0 refills | Status: DC | PRN

## 2020-10-16 NOTE — MAU Provider Note (Signed)
History     CSN: [id number]  Arrival date and time: 10/16/20 1446   Event Date/Time   First Provider Initiated Contact with Patient 10/16/20 1533      Chief Complaint  Patient presents with  . Abdominal Cramping   HPI Regina Schwartz is a 25 y.o. G3P1011 at [redacted]w[redacted]d who presents to MAU with chief complaint of suprapubic pain. This is a recurrent problem, onset in early pregnancy. Patient's pain score is 5/10. Her pain does not radiate. She denies aggravating or alleviating factors. She has not taken medication or tried other treatments for this complaint. She verbalizes to CNM that she is leaving for her honeymoon tomorrow and just wants "one last check to make sure everything is ok".  Patient denies vaginal bleeding, abdominal tenderness, dysuria, fever, falls, or recent illness.   Patient receives care with [facility].  OB History    Gravida  3   Para  1   Term  1   Preterm      AB  1   Living  1     SAB  1   IAB      Ectopic      Multiple  0   Live Births  1           Past Medical History:  Diagnosis Date  . Anxiety     Past Surgical History:  Procedure Laterality Date  . ADENOIDECTOMY  age 61  . DILATION AND EVACUATION N/A 04/19/2015   Procedure: DILATATION AND EVACUATION;  Surgeon: Edwinna Areola, DO;  Location: [facility];  Service: Gynecology;  Laterality: N/A;  . TYMPANOSTOMY TUBE PLACEMENT      Family History  Problem Relation Age of Onset  . Migraines Mother   . Seizures Father   . Pancreatitis Father   . Asthma Brother   . Drug abuse Cousin     Social History   Tobacco Use  . Smoking status: Former Smoker    Packs/day: 0.50    Years: 4.00    Pack years: 2.00    Types: Cigarettes    Quit date: 02/14/2015    Years since quitting: 5.6  . Smokeless tobacco: Never Used  Vaping Use  . Vaping Use: Never used  Substance Use Topics  . Alcohol use: No  . Drug use: No    Allergies: No Known Allergies  Medications Prior to Admission   Medication Sig Dispense Refill Last Dose  . Prenatal Vit-Fe Fumarate-FA (PRENATAL MULTIVITAMIN) TABS tablet Take 1 tablet by mouth daily at 12 noon.   10/15/2020 at Unknown time  . naproxen (NAPROSYN) 500 MG tablet Take 1 tablet (500 mg total) by mouth 2 (two) times daily. 30 tablet 0     Review of Systems  Gastrointestinal: Positive for abdominal pain.  All other systems reviewed and are negative.  Physical Exam   Blood pressure 108/60, pulse 83, temperature 98.1 F (36.7 C), temperature source Oral, resp. rate 16, height 5\' 4"  (1.626 m), weight 57.3 kg, last menstrual period 03/02/2020, SpO2 100 %.  Physical Exam Vitals and nursing note reviewed.  Constitutional:      Appearance: Normal appearance. She is not ill-appearing.  Cardiovascular:     Rate and Rhythm: Normal rate.     Pulses: Normal pulses.  Pulmonary:     Effort: Pulmonary effort is normal.     Breath sounds: Normal breath sounds.  Abdominal:     Tenderness: There is no abdominal tenderness. There is no right CVA tenderness  or left CVA tenderness.  Skin:    Capillary Refill: Capillary refill takes less than 2 seconds.  Neurological:     Mental Status: She is alert and oriented to person, place, and time.  Psychiatric:        Mood and Affect: Mood normal.        Behavior: Behavior normal.        Thought Content: Thought content normal.        Judgment: Judgment normal.     MAU Course  Procedures   --Patient discharged prior to UA result. Called at home at 1700, identity confirmed with identifier x 2. Discussed finding of ketonuria, typical sources including caloric restriction, recurrent vomiting. Patient denies.   Patient Vitals for the past 24 hrs:  BP Temp Temp src Pulse Resp SpO2 Height Weight  10/16/20 1529 108/60 -- -- 83 -- -- -- --  10/16/20 1518 126/66 98.1 F (36.7 C) Oral (!) 110 16 100 % -- --  10/16/20 1513 -- -- -- -- -- -- 5\' 4"  (1.626 m) 57.3 kg   Results for orders placed or performed  during the hospital encounter of 10/16/20 (from the past 24 hour(s))  Urinalysis, Routine w reflex microscopic Urine, Clean Catch     Status: Abnormal   Collection Time: 10/16/20  3:18 PM  Result Value Ref Range   Color, Urine YELLOW YELLOW   APPearance CLEAR CLEAR   Specific Gravity, Urine 1.019 1.005 - 1.030   pH 5.0 5.0 - 8.0   Glucose, UA NEGATIVE NEGATIVE mg/dL   Hgb urine dipstick NEGATIVE NEGATIVE   Bilirubin Urine NEGATIVE NEGATIVE   Ketones, ur 80 (A) NEGATIVE mg/dL   Protein, ur NEGATIVE NEGATIVE mg/dL   Nitrite NEGATIVE NEGATIVE   Leukocytes,Ua NEGATIVE NEGATIVE   Meds ordered this encounter  Medications  . acetaminophen (TYLENOL) 325 MG tablet    Sig: Take 2 tablets (650 mg total) by mouth every 6 (six) hours as needed.    Dispense:  180 tablet    Refill:  0    Order Specific Question:   Supervising Provider    Answer:   10/18/20 Myna Hidalgo    Assessment and Plan  --25 y.o. G3P1011 at [redacted]w[redacted]d  --FHT 155 by Doppler --No acute findings on exam --Discharge home in stable condition  [redacted]w[redacted]d, CNM 10/16/2020, 7:19 PM

## 2020-10-16 NOTE — MAU Note (Incomplete)
Pt presents with abdominal cramping that began this morning. Denies vaginal bleeding or discharge.

## 2020-10-16 NOTE — Discharge Instructions (Signed)
Abdominal Pain During Pregnancy Abdominal pain is common during pregnancy and has many possible causes. Some causes are more serious than others, and sometimes the cause is not known. Abdominal pain can be a sign that labor is starting. It can also be caused by normal growth of your baby causing stretching of muscles and ligaments during pregnancy. Always tell your health care provider if you have any abdominal pain. Follow these instructions at home:  Do not have sex or put anything in your vagina until your pain goes away completely.  Get plenty of rest until your pain improves.  Drink enough fluid to keep your urine pale yellow.  Take over-the-counter and prescription medicines only as told by your health care provider.  Keep all follow-up visits. This is important.   Contact a health care provider if:  Your pain continues or gets worse after resting.  You have lower abdominal pain that: ? Comes and goes at regular intervals. ? Spreads to your back. ? Is similar to menstrual cramps.  You have pain or burning when you urinate. Get help right away if:  You have a fever, chills, or shortness of breath.  You have vaginal bleeding.  You are leaking fluid or passing tissue from your vagina.  You have vomiting or diarrhea that lasts for more than 24 hours.  Your baby is moving less than usual.  You feel very weak or faint.  You develop severe pain in your upper abdomen. Summary  Abdominal pain is common during pregnancy and has many possible causes.  If you experience abdominal pain during pregnancy, tell your health care provider right away.  Follow your health care provider's home care instructions and keep all follow-up visits as told. This information is not intended to replace advice given to you by your health care provider. Make sure you discuss any questions you have with your health care provider. Document Revised: 01/31/2020 Document Reviewed: 01/31/2020 Elsevier  Patient Education  2021 [employer]vier Inc.  

## 2020-10-22 LAB — OB RESULTS CONSOLE HEPATITIS B SURFACE ANTIGEN: Hepatitis B Surface Ag: NEGATIVE

## 2020-10-22 LAB — OB RESULTS CONSOLE RUBELLA ANTIBODY, IGM: Rubella: IMMUNE

## 2020-10-22 LAB — OB RESULTS CONSOLE RPR: RPR: NONREACTIVE

## 2020-10-30 LAB — HEPATITIS C ANTIBODY: HCV Ab: NEGATIVE

## 2020-11-09 ENCOUNTER — Inpatient Hospital Stay (HOSPITAL_COMMUNITY)
Admission: AD | Admit: 2020-11-09 | Discharge: 2020-11-09 | Disposition: A | Discharge status: Home / Self Care | Payer: Medicaid Other | Attending: Obstetrics and Gynecology | Admitting: Obstetrics and Gynecology

## 2020-11-09 ENCOUNTER — Inpatient Hospital Stay (HOSPITAL_BASED_OUTPATIENT_CLINIC_OR_DEPARTMENT_OTHER): Payer: Medicaid Other

## 2020-11-09 ENCOUNTER — Other Ambulatory Visit: Payer: Self-pay

## 2020-11-09 DIAGNOSIS — D72829 Elevated white blood cell count, unspecified: Secondary | ICD-10-CM | POA: Insufficient documentation

## 2020-11-09 DIAGNOSIS — O26892 Other specified pregnancy related conditions, second trimester: Secondary | ICD-10-CM | POA: Insufficient documentation

## 2020-11-09 DIAGNOSIS — O99012 Anemia complicating pregnancy, second trimester: Secondary | ICD-10-CM | POA: Diagnosis not present

## 2020-11-09 DIAGNOSIS — R109 Unspecified abdominal pain: Secondary | ICD-10-CM | POA: Insufficient documentation

## 2020-11-09 DIAGNOSIS — O99112 Other diseases of the blood and blood-forming organs and certain disorders involving the immune mechanism complicating pregnancy, second trimester: Secondary | ICD-10-CM | POA: Insufficient documentation

## 2020-11-09 DIAGNOSIS — Z3A16 16 weeks gestation of pregnancy: Secondary | ICD-10-CM | POA: Insufficient documentation

## 2020-11-09 LAB — CBC WITH DIFFERENTIAL/PLATELET
Abs Immature Granulocytes: 0.09 10*3/uL — ABNORMAL HIGH (ref 0.00–0.07)
Basophils Absolute: 0 10*3/uL (ref 0.0–0.1)
Basophils Relative: 0 %
Eosinophils Absolute: 0 10*3/uL (ref 0.0–0.5)
Eosinophils Relative: 0 %
HCT: 34.2 % — ABNORMAL LOW (ref 36.0–46.0)
Hemoglobin: 11.9 g/dL — ABNORMAL LOW (ref 12.0–15.0)
Immature Granulocytes: 1 %
Lymphocytes Relative: 7 %
Lymphs Abs: 1 10*3/uL (ref 0.7–4.0)
MCH: 31.9 pg (ref 26.0–34.0)
MCHC: 34.8 g/dL (ref 30.0–36.0)
MCV: 91.7 fL (ref 80.0–100.0)
Monocytes Absolute: 0.7 10*3/uL (ref 0.1–1.0)
Monocytes Relative: 5 %
Neutro Abs: 12.6 10*3/uL — ABNORMAL HIGH (ref 1.7–7.7)
Neutrophils Relative %: 87 %
Platelets: 150 10*3/uL (ref 150–400)
RBC: 3.73 MIL/uL — ABNORMAL LOW (ref 3.87–5.11)
RDW: 11.7 % (ref 11.5–15.5)
WBC: 14.4 10*3/uL — ABNORMAL HIGH (ref 4.0–10.5)
nRBC: 0 % (ref 0.0–0.2)

## 2020-11-09 LAB — COMPREHENSIVE METABOLIC PANEL
ALT: 8 U/L (ref 0–44)
AST: 17 U/L (ref 15–41)
Albumin: 3.5 g/dL (ref 3.5–5.0)
Alkaline Phosphatase: 44 U/L (ref 38–126)
Anion gap: 10 (ref 5–15)
BUN: 7 mg/dL (ref 6–20)
CO2: 19 mmol/L — ABNORMAL LOW (ref 22–32)
Calcium: 8.8 mg/dL — ABNORMAL LOW (ref 8.9–10.3)
Chloride: 105 mmol/L (ref 98–111)
Creatinine, Ser: 0.46 mg/dL (ref 0.44–1.00)
GFR, Estimated: >60 mL/min (ref >60)
Glucose, Bld: 80 mg/dL (ref 70–99)
Potassium: 3.3 mmol/L — ABNORMAL LOW (ref 3.5–5.1)
Sodium: 134 mmol/L — ABNORMAL LOW (ref 135–145)
Total Bilirubin: 0.3 mg/dL (ref 0.3–1.2)
Total Protein: 6.2 g/dL — ABNORMAL LOW (ref 6.5–8.1)

## 2020-11-09 LAB — URINALYSIS, ROUTINE W REFLEX MICROSCOPIC
Bilirubin Urine: NEGATIVE
Glucose, UA: NEGATIVE mg/dL
Hgb urine dipstick: NEGATIVE
Ketones, ur: 20 mg/dL — AB
Nitrite: NEGATIVE
Protein, ur: NEGATIVE mg/dL
Specific Gravity, Urine: 1.012 (ref 1.005–1.030)
pH: 6 (ref 5.0–8.0)

## 2020-11-09 NOTE — MAU Provider Note (Signed)
History     CSN: [id number]  Arrival date and time: 11/09/20 1431   Event Date/Time   First Provider Initiated Contact with Patient 11/09/20 1603      Chief Complaint  Patient presents with   Abdominal Pain   HPI  Ms.Regina Schwartz is a 25 y.o. female G72P1011 @ [redacted]w[redacted]d here with acute onset of abdominal pain. The pain started at 1300 today. The pain is constant. Nothing makes it better, nothing makes it worsened. She currently rates her pain 6/10.  She took tylenol at 1330; which helped some. She has no fever, no nausea or vomiting.   OB History     Gravida  3   Para  1   Term  1   Preterm      AB  1   Living  1      SAB  1   IAB      Ectopic      Multiple  0   Live Births  1           Past Medical History:  Diagnosis Date   Anxiety     Past Surgical History:  Procedure Laterality Date   ADENOIDECTOMY  age 62   DILATION AND EVACUATION N/A 04/19/2015   Procedure: DILATATION AND EVACUATION;  Surgeon: Edwinna Areola, DO;  Location: [facility];  Service: Gynecology;  Laterality: N/A;   TYMPANOSTOMY TUBE PLACEMENT      Family History  Problem Relation Age of Onset   Migraines Mother    Seizures Father    Pancreatitis Father    Asthma Brother    Drug abuse Cousin     Social History   Tobacco Use   Smoking status: Former    Packs/day: 0.50    Years: 4.00    Pack years: 2.00    Types: Cigarettes    Quit date: 02/14/2015    Years since quitting: 5.7   Smokeless tobacco: Never  Vaping Use   Vaping Use: Never used  Substance Use Topics   Alcohol use: No   Drug use: No    Allergies: No Known Allergies  Medications Prior to Admission  Medication Sig Dispense Refill Last Dose   acetaminophen (TYLENOL) 325 MG tablet Take 2 tablets (650 mg total) by mouth every 6 (six) hours as needed. 180 tablet 0 11/09/2020   Prenatal Vit-Fe Fumarate-FA (PRENATAL MULTIVITAMIN) TABS tablet Take 1 tablet by mouth daily at 12 noon.   11/09/2020   Results  for orders placed or performed during the hospital encounter of 11/09/20 (from the past 48 hour(s))  Urinalysis, Routine w reflex microscopic Urine, Clean Catch     Status: Abnormal   Collection Time: 11/09/20  3:59 PM  Result Value Ref Range   Color, Urine YELLOW YELLOW   APPearance HAZY (A) CLEAR   Specific Gravity, Urine 1.012 1.005 - 1.030   pH 6.0 5.0 - 8.0   Glucose, UA NEGATIVE NEGATIVE mg/dL   Hgb urine dipstick NEGATIVE NEGATIVE   Bilirubin Urine NEGATIVE NEGATIVE   Ketones, ur 20 (A) NEGATIVE mg/dL   Protein, ur NEGATIVE NEGATIVE mg/dL   Nitrite NEGATIVE NEGATIVE   Leukocytes,Ua TRACE (A) NEGATIVE   RBC / HPF 0-5 0 - 5 RBC/hpf   WBC, UA 0-5 0 - 5 WBC/hpf   Bacteria, UA RARE (A) NONE SEEN   Squamous Epithelial / LPF 11-20 0 - 5    Comment: Performed at [facility], [address], [place], [place] [address]  CBC with Differential/Platelet     Status: Abnormal   Collection Time: 11/09/20  4:18 PM  Result Value Ref Range   WBC 14.4 (H) 4.0 - 10.5 K/uL   RBC 3.73 (L) 3.87 - 5.11 MIL/uL   Hemoglobin 11.9 (L) 12.0 - 15.0 g/dL   HCT 22.0 (L) 25.4 - 27.0 %   MCV 91.7 80.0 - 100.0 fL   MCH 31.9 26.0 - 34.0 pg   MCHC 34.8 30.0 - 36.0 g/dL   RDW 62.3 76.2 - 83.1 %   Platelets 150 150 - 400 K/uL   nRBC 0.0 0.0 - 0.2 %   Neutrophils Relative % 87 %   Neutro Abs 12.6 (H) 1.7 - 7.7 K/uL   Lymphocytes Relative 7 %   Lymphs Abs 1.0 0.7 - 4.0 K/uL   Monocytes Relative 5 %   Monocytes Absolute 0.7 0.1 - 1.0 K/uL   Eosinophils Relative 0 %   Eosinophils Absolute 0.0 0.0 - 0.5 K/uL   Basophils Relative 0 %   Basophils Absolute 0.0 0.0 - 0.1 K/uL   Immature Granulocytes 1 %   Abs Immature Granulocytes 0.09 (H) 0.00 - 0.07 K/uL    Comment: Performed at [facility], [address], [place], [place] [address]  Comprehensive metabolic panel     Status: Abnormal   Collection Time: 11/09/20  4:18 PM  Result Value Ref Range   Sodium 134 (L) 135 - 145 mmol/L   Potassium  3.3 (L) 3.5 - 5.1 mmol/L   Chloride 105 98 - 111 mmol/L   CO2 19 (L) 22 - 32 mmol/L   Glucose, Bld 80 70 - 99 mg/dL    Comment: Glucose reference range applies only to samples taken after fasting for at least 8 hours.   BUN 7 6 - 20 mg/dL   Creatinine, Ser 6.07 0.44 - 1.00 mg/dL   Calcium 8.8 (L) 8.9 - 10.3 mg/dL   Total Protein 6.2 (L) 6.5 - 8.1 g/dL   Albumin 3.5 3.5 - 5.0 g/dL   AST 17 15 - 41 U/L   ALT 8 0 - 44 U/L   Alkaline Phosphatase 44 38 - 126 U/L   Total Bilirubin 0.3 0.3 - 1.2 mg/dL   GFR, Estimated >37 >10 mL/min    Comment: (NOTE) Calculated using the CKD-EPI Creatinine Equation (2021)    Anion gap 10 5 - 15    Comment: Performed at [facility], [address] [place], [place] [address]    No results found.   Review of Systems  Constitutional:  Negative for fever.  Gastrointestinal:  Negative for constipation, diarrhea, nausea and vomiting.  Genitourinary:  Negative for dysuria, vaginal bleeding and vaginal discharge.  Physical Exam   Blood pressure (!) 117/57, pulse 90, temperature 98 F (36.7 C), temperature source Oral, resp. rate 17, height 5\' 4"  (1.626 m), weight 59.4 kg, last menstrual period 03/02/2020, SpO2 100 %.  Physical Exam Vitals and nursing note reviewed.  Constitutional:      General: She is not in acute distress.    Appearance: She is well-developed. She is not ill-appearing or toxic-appearing.  Abdominal:     General: Abdomen is flat.     Palpations: Abdomen is soft.     Tenderness: There is generalized abdominal tenderness. There is no guarding or rebound.  Neurological:     Mental Status: She is alert.   MAU Course  Procedures None  MDM  OB limited 05/02/2020 normal with normal cervical length: >  4 cm Leukocytosis however no fever, no nausea or vomiting, no rebound tenderness on exam:  Pain now 3/10- without treatment.  Assessment and Plan   A:  1. [redacted] weeks gestation of pregnancy   2. Abdominal cramping affecting pregnancy    3. Leukocytosis, unspecified type      P:  Discharge home in stable condition Strict return precautions Reviewed signs and symptoms of acute appendicitis.  Increase oral fluids  [place] Basham, Harolyn Rutherford, NP 11/09/2020 6:51 PM

## 2020-11-09 NOTE — MAU Note (Signed)
Around 1300, sudden onset of rt sided pain. "it just hurts. Never felt like this, but ain't went away or got worse"

## 2020-11-10 DIAGNOSIS — O26892 Other specified pregnancy related conditions, second trimester: Secondary | ICD-10-CM | POA: Diagnosis not present

## 2020-11-10 DIAGNOSIS — R109 Unspecified abdominal pain: Secondary | ICD-10-CM | POA: Diagnosis not present

## 2020-11-10 DIAGNOSIS — Z3A16 16 weeks gestation of pregnancy: Secondary | ICD-10-CM

## 2021-01-22 LAB — OB RESULTS CONSOLE HIV ANTIBODY (ROUTINE TESTING): HIV: NONREACTIVE

## 2021-03-28 LAB — OB RESULTS CONSOLE GC/CHLAMYDIA: Gonorrhea: NEGATIVE

## 2021-04-04 LAB — OB RESULTS CONSOLE GBS: GBS: NEGATIVE

## 2021-04-13 IMAGING — US US BREAST*L* LIMITED INC AXILLA
1 series · 6 of 6 positions shown · non-contrast
Comparison: Previous exam(s).

CLINICAL DATA: 24-year-old female presenting for follow-up of a
likely benign left breast mass.

EXAM:
ULTRASOUND OF THE LEFT BREAST

[Series 1: us breast*left* limited inc axilla · 0.06mm/px · 6 of 6 slices shown]
[im 1/6]
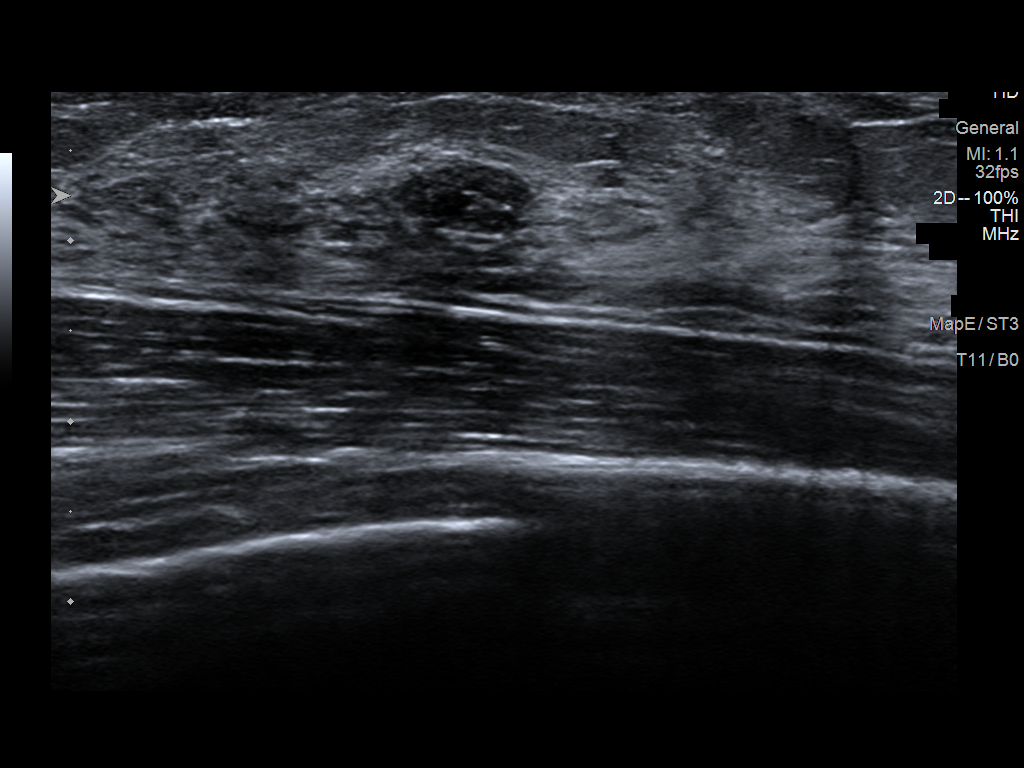
[im 2/6]
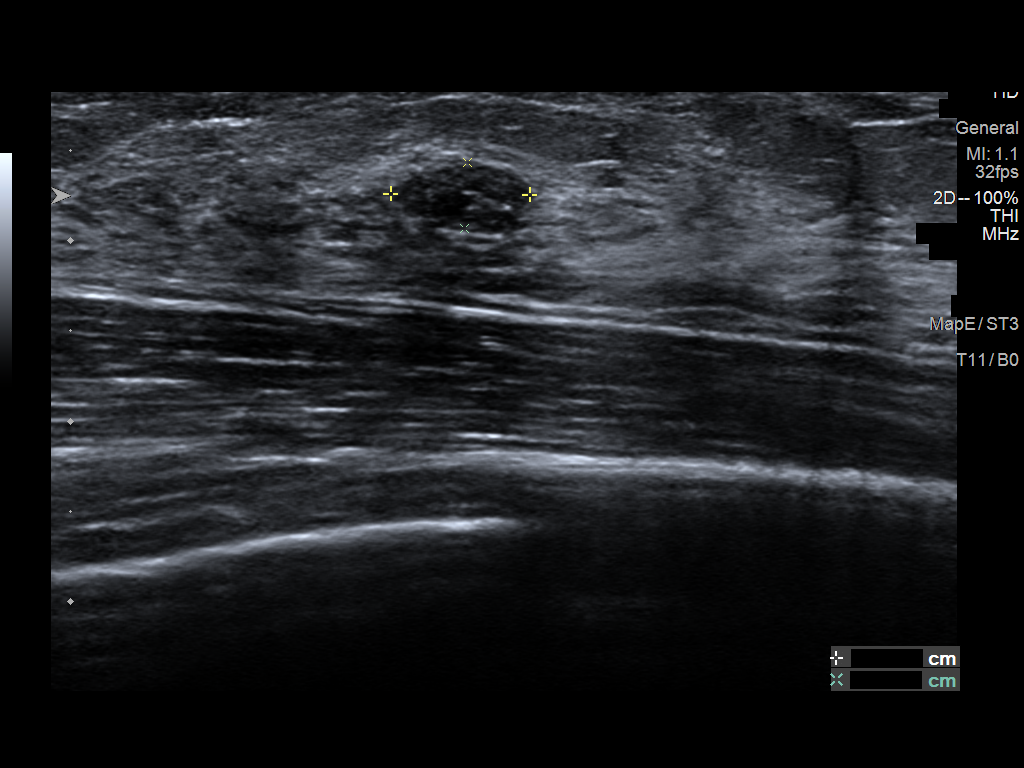
[im 3/6]
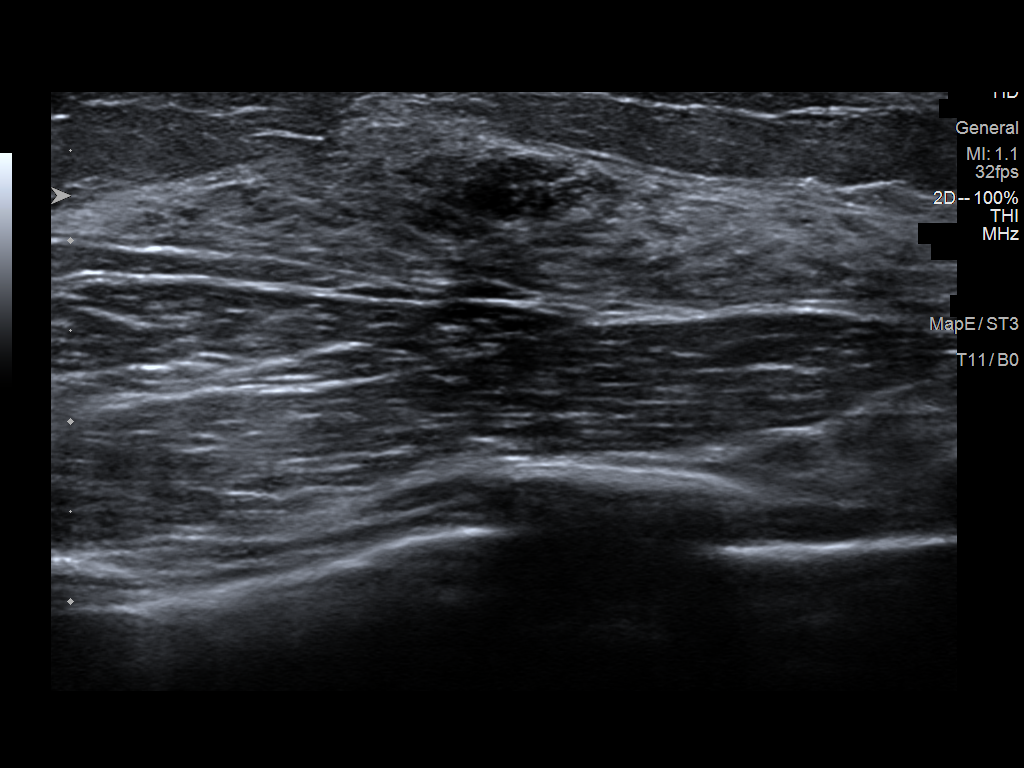
[im 4/6]
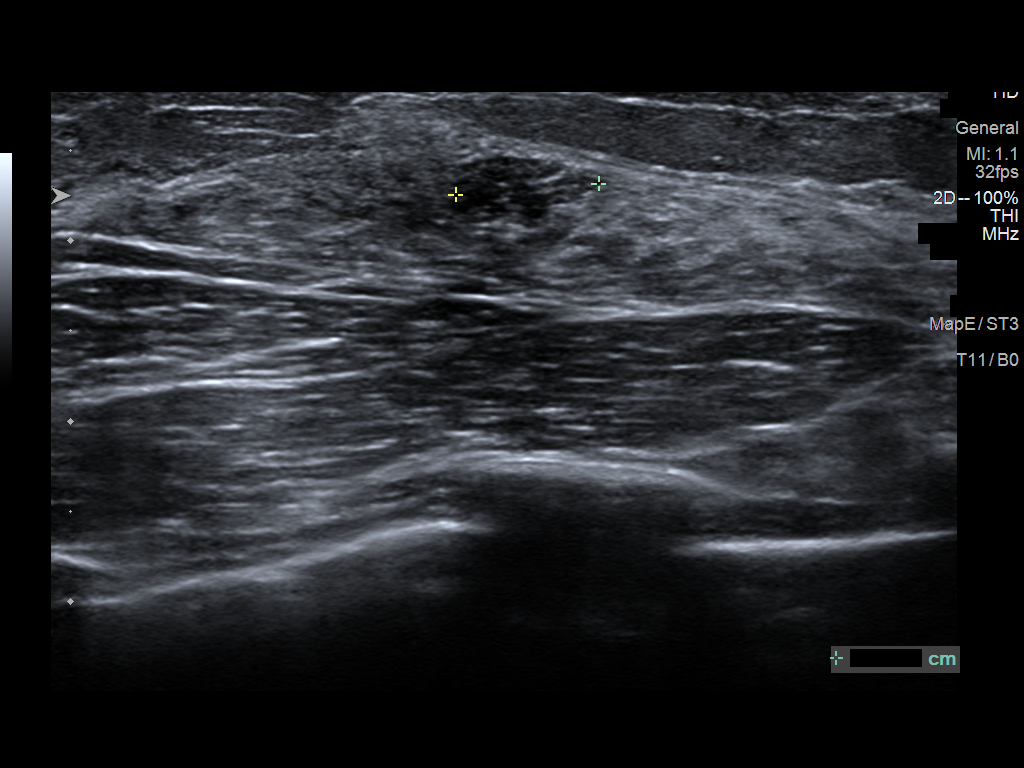
[im 5/6]
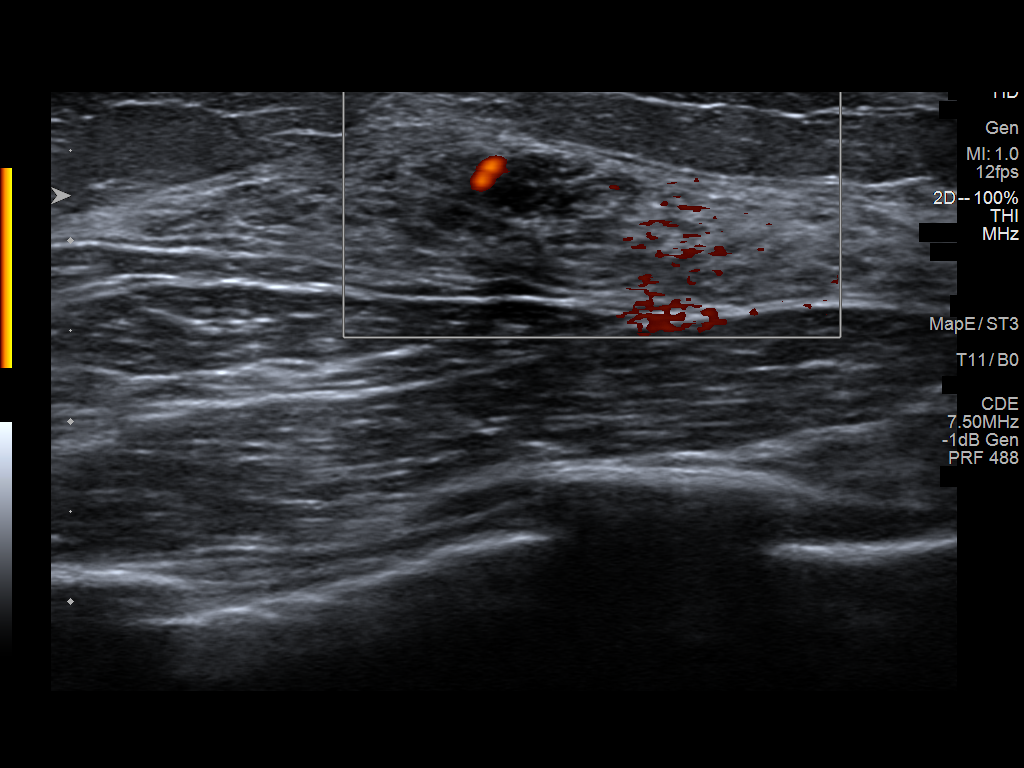
[im 6/6]
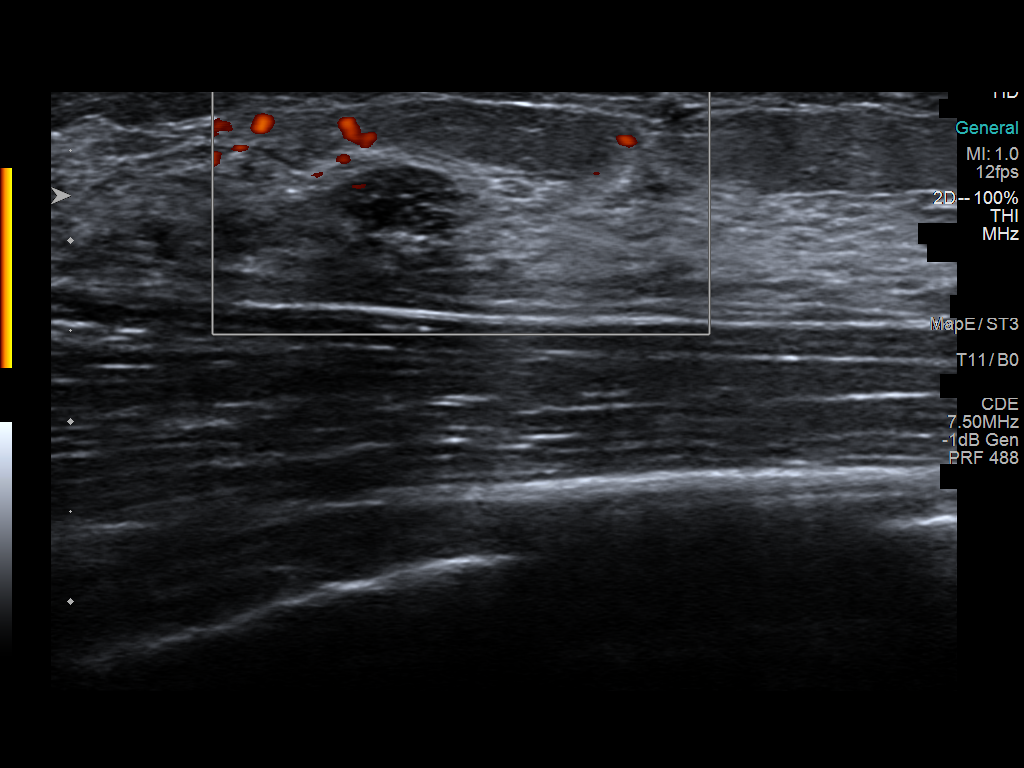

[6 of 6 positions shown; findings below may reference images not displayed]

FINDINGS: Ultrasound of the left breast at [DATE], 4 cm from the nipple
demonstrates a stable oval mass measuring 8 x 4 x 8 mm, previously 9
x 4 x 8 mm.
IMPRESSION: The likely benign left breast mass at [DATE] is stable.

RECOMMENDATION:
Six-month follow-up left breast ultrasound.

I have discussed the findings and recommendations with the patient.
If applicable, a reminder letter will be sent to the patient
regarding the next appointment.

BI-RADS CATEGORY  3: Probably benign.

## 2021-04-15 ENCOUNTER — Other Ambulatory Visit: Payer: Self-pay

## 2021-04-15 ENCOUNTER — Inpatient Hospital Stay (HOSPITAL_COMMUNITY)
Admission: AD | Admit: 2021-04-15 | Discharge: 2021-04-15 | Disposition: A | Discharge status: Home / Self Care | Payer: Medicaid Other | Attending: Obstetrics & Gynecology | Admitting: Obstetrics & Gynecology

## 2021-04-15 ENCOUNTER — Encounter (HOSPITAL_COMMUNITY): Payer: Self-pay | Admitting: Obstetrics & Gynecology

## 2021-04-15 DIAGNOSIS — O471 False labor at or after 37 completed weeks of gestation: Secondary | ICD-10-CM | POA: Insufficient documentation

## 2021-04-15 DIAGNOSIS — Z3A38 38 weeks gestation of pregnancy: Secondary | ICD-10-CM | POA: Diagnosis not present

## 2021-04-15 DIAGNOSIS — O479 False labor, unspecified: Secondary | ICD-10-CM

## 2021-04-15 NOTE — MAU Provider Note (Signed)
S: Patient is here for RN labor evaluation. Strip, vital signs, & chart Reviewed   O:  Vitals:   04/15/21 1830 04/15/21 1847 04/15/21 1938  BP: 132/81 126/84 116/83  Pulse: 93 89 87  Resp: 16    Temp: 98.2 F (36.8 C)    TempSrc: Oral    SpO2: 100%    Weight: 71.4 kg    Height: 5\' 4"  (1.626 m)     No results found for this or any previous visit (from the past 24 hour(s)).  Dilation: 1 Effacement (%): 50 Cervical Position: Posterior Station: Ballotable Presentation: Vertex Exam by:: K.Wilson,RN   FHR: 135 bpm, Mod Var, no Decels, 15x15 Accels UC: irregular   A: 1. False labor   2. [redacted] weeks gestation of pregnancy      P:  RN to discharge home in stable condition with return precautions & fetal kick counts  [phone] FNP 7:44 PM

## 2021-04-15 NOTE — MAU Note (Signed)
started contracting around 3.  Contractions  q 4-5. No recent exams.  No bleeding.  ? Wetness while in shower, none since. +FM.  nauseated.

## 2021-04-17 ENCOUNTER — Inpatient Hospital Stay (HOSPITAL_COMMUNITY)
Admission: AD | Admit: 2021-04-17 | Discharge: 2021-04-18 | Disposition: A | Discharge status: Home / Self Care | Payer: Medicaid Other | Attending: Obstetrics and Gynecology | Admitting: Obstetrics and Gynecology

## 2021-04-17 ENCOUNTER — Encounter (HOSPITAL_COMMUNITY): Payer: Self-pay | Admitting: Obstetrics and Gynecology

## 2021-04-17 ENCOUNTER — Other Ambulatory Visit: Payer: Self-pay

## 2021-04-17 DIAGNOSIS — O479 False labor, unspecified: Secondary | ICD-10-CM

## 2021-04-17 DIAGNOSIS — Z3A39 39 weeks gestation of pregnancy: Secondary | ICD-10-CM | POA: Insufficient documentation

## 2021-04-17 NOTE — MAU Note (Signed)
.  Regina Schwartz is a 25 y.o. at [redacted]w[redacted]d here in MAU reporting: CTX 2-6  mins, yesterday and today with increased intensity around 1600, pain rating 7/10. Back pain with CTX pain ratig 4/10. Pt also report nausea intermittent today and able to eat and drink some. Pt denies LOF, VB, DFM, and PIH s/s.   SVE 1 cm last Monday Onset of complaint: 1600 Pain score: 7/10, 4/10 Vitals:   04/17/21 2121  BP: 124/82  Pulse: 96  Resp: 18  Temp: 98.2 F (36.8 C)  SpO2: 99%     FHT:140  Lab orders placed from triage: none

## 2021-04-18 DIAGNOSIS — Z3A39 39 weeks gestation of pregnancy: Secondary | ICD-10-CM | POA: Diagnosis not present

## 2021-04-18 DIAGNOSIS — O479 False labor, unspecified: Secondary | ICD-10-CM

## 2021-04-18 MED ORDER — OXYCODONE-ACETAMINOPHEN 5-325 MG PO TABS
ORAL_TABLET | ORAL | Status: AC
  Filled 2021-04-18: qty 1

## 2021-04-18 MED ORDER — OXYCODONE-ACETAMINOPHEN 5-325 MG PO TABS: 1.0000 | ORAL_TABLET | Freq: Once | ORAL | Status: DC

## 2021-04-18 MED ORDER — OXYCODONE-ACETAMINOPHEN 5-325 MG PO TABS
1.0000 | ORAL_TABLET | Freq: Once | ORAL | Status: AC
  Administered 2021-04-18: 1 via ORAL

## 2021-04-18 NOTE — MAU Provider Note (Signed)
Chief Complaint:  Contractions   None    HPI: Regina Schwartz is a 25 y.o. G3P1011 at 66w2dwho presents to maternity admissions reporting painful contractions.. She reports good fetal movement, denies LOF, vaginal bleeding,  h/a, dizziness, n/v, diarrhea, constipation or fever/chills.  .  Abdominal Pain The current episode started today. The problem occurs intermittently. The problem has been unchanged. The quality of the pain is cramping. Associated symptoms include nausea. Pertinent negatives include no fever or vomiting. Nothing aggravates the pain. The pain is relieved by Nothing. She has tried nothing for the symptoms.   RN Note: Regina Schwartz is a 25 y.o. at [redacted]w[redacted]d here in MAU reporting: CTX 2-6  mins, yesterday and today with increased intensity around 1600, pain rating 7/10. Back pain with CTX pain ratig 4/10. Pt also report nausea intermittent today and able to eat and drink some. Pt denies LOF, VB, DFM, and PIH s/s.    SVE 1 cm last Monday Onset of complaint: 1600 Pain score: 7/10, 4/10  Past Medical History: Past Medical History:  Diagnosis Date   Anxiety     Past obstetric history: OB History  Gravida Para Term Preterm AB Living  3 1 1   1 1   SAB IAB Ectopic Multiple Live Births  1     0 1    # Outcome Date GA Lbr Len/2nd Weight Sex Delivery Anes PTL Lv  3 Current           2 Term 03/20/16 [redacted]w[redacted]d 09:31 / 01:26 2770 g F Vag-Vacuum EPI  LIV  1 SAB 2016            Past Surgical History: Past Surgical History:  Procedure Laterality Date   ADENOIDECTOMY  age 25   DILATION AND CURETTAGE OF UTERUS     DILATION AND EVACUATION N/A 04/19/2015   Procedure: DILATATION AND EVACUATION;  Surgeon: 04/21/2015, DO;  Location: [facility];  Service: Gynecology;  Laterality: N/A;   TYMPANOSTOMY TUBE PLACEMENT      Family History: Family History  Problem Relation Age of Onset   Migraines Mother    Seizures Father    Pancreatitis Father    Asthma Brother    Drug abuse  Cousin     Social History: Social History   Tobacco Use   Smoking status: Former    Packs/day: 0.50    Years: 4.00    Pack years: 2.00    Types: Cigarettes    Quit date: 02/14/2015    Years since quitting: 6.1   Smokeless tobacco: Never  Vaping Use   Vaping Use: Former   Quit date: 07/31/2020  Substance Use Topics   Alcohol use: No   Drug use: No    Allergies: No Known Allergies  Meds:  Medications Prior to Admission  Medication Sig Dispense Refill Last Dose   acetaminophen (TYLENOL) 325 MG tablet Take 2 tablets (650 mg total) by mouth every 6 (six) hours as needed. 180 tablet 0    Prenatal Vit-Fe Fumarate-FA (PRENATAL MULTIVITAMIN) TABS tablet Take 1 tablet by mouth daily at 12 noon.       I have reviewed patient's Past Medical Hx, Surgical Hx, Family Hx, Social Hx, medications and allergies.   ROS:  Review of Systems  Constitutional:  Negative for fever.  Gastrointestinal:  Positive for abdominal pain and nausea. Negative for vomiting.  Other systems negative  Physical Exam  Patient Vitals for the past 24 hrs:  BP Temp Temp src Pulse Resp  SpO2 Height Weight  04/17/21 2330 (!) 127/94 -- -- 86 -- -- -- --  04/17/21 2300 123/73 -- -- 86 -- 100 % -- --  04/17/21 2230 126/88 -- -- 81 -- 97 % -- --  04/17/21 2200 135/79 -- -- (!) 102 -- 96 % -- --  04/17/21 2154 131/82 -- -- 98 20 98 % -- --  04/17/21 2121 124/82 98.2 F (36.8 C) Oral 96 18 99 % 5\' 4"  (1.626 m) 70.9 kg   Constitutional: Well-developed, well-nourished female in no acute distress.  Cardiovascular: normal rate and rhythm Respiratory: normal effort, clear to auscultation bilaterally GI: Abd soft, non-tender, gravid appropriate for gestational age.   No rebound or guarding. MS: Extremities nontender, no edema, normal ROM Neurologic: Alert and oriented x 4.  GU: Neg CVAT.  PELVIC EXAM:  Dilation: 2 Effacement (%): 50 Cervical Position: Posterior Station: -2 Exam by:: [email or web address], RN We gave her a  full 3 hours of evaluation and she did not change her cervix  FHT:  Baseline 140 , moderate variability, accelerations present, no decelerations Contractions: Irregular  2-3 min   Labs: No results found for this or any previous visit (from the past 24 hour(s)).    Imaging:  No results found.  MAU Course/MDM: NST reviewed, reactive 3 hour labor eval with no change Gave her one Percocet for pain which slowed her contractions and helped with her pain States has an appt this morning at office  Assessment: Single IUP at [redacted]w[redacted]d Prodromal contractions Reactive Nonstress test  Plan: Discharge home Labor precautions and fetal kick counts Follow up in Office for prenatal visits  Encouraged to return if she develops worsening of symptoms, increase in pain, fever, or other concerning symptoms.  Pt stable at time of discharge.  [redacted]w[redacted]d CNM, MSN Certified Nurse-Midwife 04/18/2021 12:06 AM

## 2021-04-19 ENCOUNTER — Other Ambulatory Visit: Payer: Self-pay | Admitting: Obstetrics & Gynecology

## 2021-04-22 ENCOUNTER — Inpatient Hospital Stay (HOSPITAL_COMMUNITY)
Admission: AD | Admit: 2021-04-22 | Discharge: 2021-04-23 | DRG: 807 | Disposition: A | Discharge status: Home / Self Care | Payer: Medicaid Other | Attending: Obstetrics & Gynecology | Admitting: Obstetrics & Gynecology

## 2021-04-22 ENCOUNTER — Encounter (HOSPITAL_COMMUNITY): Payer: Self-pay | Admitting: Obstetrics and Gynecology

## 2021-04-22 ENCOUNTER — Inpatient Hospital Stay (HOSPITAL_COMMUNITY): Payer: Medicaid Other | Admitting: Anesthesiology

## 2021-04-22 ENCOUNTER — Other Ambulatory Visit: Payer: Self-pay

## 2021-04-22 DIAGNOSIS — Z87891 Personal history of nicotine dependence: Secondary | ICD-10-CM | POA: Diagnosis not present

## 2021-04-22 DIAGNOSIS — O26893 Other specified pregnancy related conditions, third trimester: Secondary | ICD-10-CM | POA: Diagnosis present

## 2021-04-22 DIAGNOSIS — Z20822 Contact with and (suspected) exposure to covid-19: Secondary | ICD-10-CM | POA: Diagnosis present

## 2021-04-22 DIAGNOSIS — Z3A39 39 weeks gestation of pregnancy: Secondary | ICD-10-CM

## 2021-04-22 LAB — RESP PANEL BY RT-PCR (FLU A&B, COVID) ARPGX2
Influenza A by PCR: NEGATIVE
Influenza B by PCR: NEGATIVE
SARS Coronavirus 2 by RT PCR: NEGATIVE

## 2021-04-22 LAB — HIV ANTIBODY (ROUTINE TESTING W REFLEX): HIV Screen 4th Generation wRfx: NONREACTIVE

## 2021-04-22 LAB — CBC
HCT: 39.9 % (ref 36.0–46.0)
Hemoglobin: 13.5 g/dL (ref 12.0–15.0)
MCH: 31.6 pg (ref 26.0–34.0)
MCHC: 33.8 g/dL (ref 30.0–36.0)
MCV: 93.4 fL (ref 80.0–100.0)
Platelets: 130 10*3/uL — ABNORMAL LOW (ref 150–400)
RBC: 4.27 MIL/uL (ref 3.87–5.11)
RDW: 12.3 % (ref 11.5–15.5)
WBC: 10.7 10*3/uL — ABNORMAL HIGH (ref 4.0–10.5)
nRBC: 0 % (ref 0.0–0.2)

## 2021-04-22 LAB — TYPE AND SCREEN
ABO/RH(D): O POS
Antibody Screen: NEGATIVE

## 2021-04-22 MED ORDER — SOD CITRATE-CITRIC ACID 500-334 MG/5ML PO SOLN: 30.0000 mL | ORAL | Status: DC | PRN

## 2021-04-22 MED ORDER — OXYCODONE HCL 5 MG PO TABS: 5.0000 mg | ORAL_TABLET | ORAL | Status: DC | PRN

## 2021-04-22 MED ORDER — PRENATAL MULTIVITAMIN CH
1.0000 | ORAL_TABLET | Freq: Every day | ORAL | Status: DC
  Administered 2021-04-23: 1 via ORAL
  Filled 2021-04-22: qty 1

## 2021-04-22 MED ORDER — ZOLPIDEM TARTRATE 5 MG PO TABS: 5.0000 mg | ORAL_TABLET | Freq: Every evening | ORAL | Status: DC | PRN

## 2021-04-22 MED ORDER — IBUPROFEN 600 MG PO TABS
600.0000 mg | ORAL_TABLET | Freq: Four times a day (QID) | ORAL | Status: DC
  Administered 2021-04-22 – 2021-04-23 (×3): 600 mg via ORAL
  Filled 2021-04-22 (×3): qty 1

## 2021-04-22 MED ORDER — DIPHENHYDRAMINE HCL 25 MG PO CAPS: 25.0000 mg | ORAL_CAPSULE | Freq: Four times a day (QID) | ORAL | Status: DC | PRN

## 2021-04-22 MED ORDER — FENTANYL-BUPIVACAINE-NACL 0.5-0.125-0.9 MG/250ML-% EP SOLN: 12.0000 mL/h | EPIDURAL | Status: DC | PRN

## 2021-04-22 MED ORDER — LACTATED RINGERS IV SOLN: 500.0000 mL | Freq: Once | INTRAVENOUS | Status: DC

## 2021-04-22 MED ORDER — OXYCODONE HCL 5 MG PO TABS: 10.0000 mg | ORAL_TABLET | ORAL | Status: DC | PRN

## 2021-04-22 MED ORDER — PHENYLEPHRINE 40 MCG/ML (10ML) SYRINGE FOR IV PUSH (FOR BLOOD PRESSURE SUPPORT)
80.0000 ug | PREFILLED_SYRINGE | INTRAVENOUS | Status: DC | PRN
  Filled 2021-04-22: qty 10

## 2021-04-22 MED ORDER — ACETAMINOPHEN 325 MG PO TABS
650.0000 mg | ORAL_TABLET | ORAL | Status: DC | PRN
  Administered 2021-04-22 – 2021-04-23 (×4): 650 mg via ORAL
  Filled 2021-04-22 (×4): qty 2

## 2021-04-22 MED ORDER — EPHEDRINE 5 MG/ML INJ: 10.0000 mg | INTRAVENOUS | Status: DC | PRN

## 2021-04-22 MED ORDER — DIPHENHYDRAMINE HCL 50 MG/ML IJ SOLN: 12.5000 mg | INTRAMUSCULAR | Status: DC | PRN

## 2021-04-22 MED ORDER — PHENYLEPHRINE 40 MCG/ML (10ML) SYRINGE FOR IV PUSH (FOR BLOOD PRESSURE SUPPORT): 80.0000 ug | PREFILLED_SYRINGE | INTRAVENOUS | Status: DC | PRN

## 2021-04-22 MED ORDER — BENZOCAINE-MENTHOL 20-0.5 % EX AERO: 1.0000 "application " | INHALATION_SPRAY | CUTANEOUS | Status: DC | PRN

## 2021-04-22 MED ORDER — ONDANSETRON HCL 4 MG/2ML IJ SOLN: 4.0000 mg | INTRAMUSCULAR | Status: DC | PRN

## 2021-04-22 MED ORDER — LACTATED RINGERS IV SOLN: 500.0000 mL | INTRAVENOUS | Status: DC | PRN

## 2021-04-22 MED ORDER — OXYCODONE-ACETAMINOPHEN 5-325 MG PO TABS: 2.0000 | ORAL_TABLET | ORAL | Status: DC | PRN

## 2021-04-22 MED ORDER — DIBUCAINE (PERIANAL) 1 % EX OINT: 1.0000 "application " | TOPICAL_OINTMENT | CUTANEOUS | Status: DC | PRN

## 2021-04-22 MED ORDER — ACETAMINOPHEN 325 MG PO TABS: 650.0000 mg | ORAL_TABLET | ORAL | Status: DC | PRN

## 2021-04-22 MED ORDER — FENTANYL-BUPIVACAINE-NACL 0.5-0.125-0.9 MG/250ML-% EP SOLN
12.0000 mL/h | EPIDURAL | Status: DC | PRN
  Administered 2021-04-22: 12 mL/h via EPIDURAL
  Filled 2021-04-22: qty 250

## 2021-04-22 MED ORDER — LIDOCAINE HCL (PF) 1 % IJ SOLN
INTRAMUSCULAR | Status: DC | PRN
  Administered 2021-04-22: 11 mL via EPIDURAL

## 2021-04-22 MED ORDER — WITCH HAZEL-GLYCERIN EX PADS: 1.0000 "application " | MEDICATED_PAD | CUTANEOUS | Status: DC | PRN

## 2021-04-22 MED ORDER — LIDOCAINE HCL (PF) 1 % IJ SOLN: 30.0000 mL | INTRAMUSCULAR | Status: DC | PRN

## 2021-04-22 MED ORDER — LACTATED RINGERS IV SOLN: INTRAVENOUS | Status: DC

## 2021-04-22 MED ORDER — ONDANSETRON HCL 4 MG/2ML IJ SOLN
4.0000 mg | Freq: Four times a day (QID) | INTRAMUSCULAR | Status: DC | PRN
  Administered 2021-04-22: 4 mg via INTRAVENOUS
  Filled 2021-04-22: qty 2

## 2021-04-22 MED ORDER — OXYCODONE-ACETAMINOPHEN 5-325 MG PO TABS: 1.0000 | ORAL_TABLET | ORAL | Status: DC | PRN

## 2021-04-22 MED ORDER — SIMETHICONE 80 MG PO CHEW: 80.0000 mg | CHEWABLE_TABLET | ORAL | Status: DC | PRN

## 2021-04-22 MED ORDER — COCONUT OIL OIL: 1.0000 "application " | TOPICAL_OIL | Status: DC | PRN

## 2021-04-22 MED ORDER — SENNOSIDES-DOCUSATE SODIUM 8.6-50 MG PO TABS
2.0000 | ORAL_TABLET | Freq: Every day | ORAL | Status: DC
  Administered 2021-04-23: 2 via ORAL
  Filled 2021-04-22: qty 2

## 2021-04-22 MED ORDER — OXYTOCIN BOLUS FROM INFUSION
333.0000 mL | Freq: Once | INTRAVENOUS | Status: AC
  Administered 2021-04-22: 333 mL via INTRAVENOUS

## 2021-04-22 MED ORDER — OXYTOCIN-SODIUM CHLORIDE 30-0.9 UT/500ML-% IV SOLN
2.5000 [IU]/h | INTRAVENOUS | Status: DC
  Filled 2021-04-22: qty 500

## 2021-04-22 MED ORDER — LACTATED RINGERS IV SOLN
500.0000 mL | Freq: Once | INTRAVENOUS | Status: AC
  Administered 2021-04-22: 500 mL via INTRAVENOUS

## 2021-04-22 MED ORDER — ONDANSETRON HCL 4 MG PO TABS: 4.0000 mg | ORAL_TABLET | ORAL | Status: DC | PRN

## 2021-04-22 MED ORDER — TETANUS-DIPHTH-ACELL PERTUSSIS 5-2.5-18.5 LF-MCG/0.5 IM SUSY: 0.5000 mL | PREFILLED_SYRINGE | Freq: Once | INTRAMUSCULAR | Status: DC

## 2021-04-22 MED ORDER — FENTANYL CITRATE (PF) 100 MCG/2ML IJ SOLN: 50.0000 ug | INTRAMUSCULAR | Status: DC | PRN

## 2021-04-22 NOTE — H&P (Signed)
OB ADMISSION/ HISTORY & PHYSICAL:  Admission Date: 04/22/2021  5:28 AM  Admit Diagnosis: Spontaneous rupture of membranes with contractions  Regina Schwartz is a 25 y.o. female G9P1011 [redacted]w[redacted]d presenting for SROM with contractions. Endorses active FM, denies vaginal bleeding. Ctx began @ 0450  History of current pregnancy: G3P1011   Primary OB Provider: [facility] Patient entered care with [facility] at 10.6 wks.   EDC 04/23/2021 by [redacted]w[redacted]d US Anatomy scan:  19 wks, complete w/ posterior placenta.   Last evaluation: [redacted]w[redacted]d   Significant prenatal events:  Vit D deficiency Lump in left breast - [facility] Patient Active Problem List   Diagnosis Date Noted   Normal labor and delivery 04/22/2021   Malnourished (HCC) 03/03/2014   Abnormal weight loss 03/03/2014   Abdominal wall mass 03/03/2014   Anxiety disorder 10/02/2011    Prenatal Labs: ABO, Rh: --/--/O POS (11/21 0630) Antibody: NEG (11/21 1601) Rubella: Immune (05/23 0000)  RPR: Nonreactive (05/23 0000)  HBsAg: Negative (05/23 0000)  HIV: Non-reactive (08/23 0000)  GTT: 85 GBS: Negative/-- (11/03 0000)  GC/CHL: negative Genetics: negative Tdap/influenza vaccines: TDAP given prenatally, No influenza this pregnancy   OB History  Gravida Para Term Preterm AB Living  3 1 1   1 1   SAB IAB Ectopic Multiple Live Births  1     0 1    # Outcome Date GA Lbr Len/2nd Weight Sex Delivery Anes PTL Lv  3 Current           2 Term 03/20/16 [redacted]w[redacted]d 09:31 / 01:26 2770 g F Vag-Vacuum EPI  LIV  1 SAB 2016            Medical / Surgical History: Past medical history:  Past Medical History:  Diagnosis Date   Anxiety     Past surgical history:  Past Surgical History:  Procedure Laterality Date   ADENOIDECTOMY  age 32   DILATION AND CURETTAGE OF UTERUS     DILATION AND EVACUATION N/A 04/19/2015   Procedure: DILATATION AND EVACUATION;  Surgeon: 04/21/2015, DO;  Location: [facility];  Service: Gynecology;  Laterality: N/A;    TYMPANOSTOMY TUBE PLACEMENT     Family History:  Family History  Problem Relation Age of Onset   Migraines Mother    Seizures Father    Pancreatitis Father    Asthma Brother    Drug abuse Cousin     Social History:  reports that she quit smoking about 6 years ago. Her smoking use included cigarettes. She has a 2.00 pack-year smoking history. She has never used smokeless tobacco. She reports that she does not drink alcohol and does not use drugs.  Allergies: Patient has no known allergies.   Current Medications at time of admission:  Prior to Admission medications   Medication Sig Start Date End Date Taking? Authorizing Provider  acetaminophen (TYLENOL) 325 MG tablet Take 2 tablets (650 mg total) by mouth every 6 (six) hours as needed. 10/16/20  Yes 10/18/20 C, CNM  Prenatal Vit-Fe Fumarate-FA (PRENATAL MULTIVITAMIN) TABS tablet Take 1 tablet by mouth daily at 12 noon.   Yes [provider]    Review of Systems: Constitutional: Negative   HENT: Negative   Eyes: Negative   Respiratory: Negative   Cardiovascular: Negative   Gastrointestinal: Negative  Genitourinary: pos for bloody show, pos for LOF   Musculoskeletal: Negative   Skin: Negative   Neurological: Negative   Endo/Heme/Allergies: Negative   Psychiatric/Behavioral: Negative  Physical Exam: VS: Blood pressure 122/72, pulse 93, temperature 98.2 F (36.8 C), temperature source Oral, resp. rate 16, height 5\' 4"  (1.626 m), weight 71.4 kg, last menstrual period 03/02/2020, SpO2 98 %. AAO x3, no signs of distress Cardiovascular: RRR Respiratory: Lung fields clear to ausculation GU/GI: Abdomen gravid, non-tender, non-distended, active FM, vertex, EFW 7lbs per Leopold's Extremities: slight edema, negative for pain, tenderness, and cords  Cervical exam:Dilation: 4.5 Effacement (%): 90 Station: -1 Exam by:: [email or web address], RN FHR: baseline rate 135 / variability moderate / accelerations present / no  decelerations TOCO: irregular   Prenatal Transfer Tool  Maternal Diabetes: No  Genetic Screening: Normal Maternal Ultrasounds/Referrals: Normal Fetal Ultrasounds or other Referrals:  None Maternal Substance Abuse:  No Significant Maternal Medications:  None Significant Maternal Lab Results: Group B Strep negative    Assessment: 25 y.o. 25 [redacted]w[redacted]d  Latent stage of labor FHR category cat 1 GBS neg Pain management plan: plans epidural    Plan:  Admit to L&D Routine admission orders Epidural PRN Will await regular contractions for now.   Dr [redacted]w[redacted]d notified of admission and plan of care  Normand Sloop MSN, CNM 04/22/2021 8:55 AM

## 2021-04-22 NOTE — Progress Notes (Signed)
Subjective:    Called to room for increased pressure  Objective:    VS: BP 121/73   Pulse 98   Temp (!) 97.5 F (36.4 C) (Oral)   Resp 16   Ht 5\' 4"  (1.626 m)   Wt 71.4 kg   LMP 03/02/2020   SpO2 99%   BMI 27.00 kg/m  FHR : baseline 135 / variability moderate / accelerations present / no decelerations Toco: contractions every 2-3 minutes  Membranes:  Dilation: 8 Effacement (%): 80 Cervical Position: Posterior Station: 0, Plus 1 Presentation: Vertex Exam by:: [email or web address], CNM   Assessment/Plan:   26 y.o. 25 [redacted]w[redacted]d  Labor: Progressing normally Fetal Wellbeing:  Category I Pain Control:  Epidural Anticipated MOD:  NSVD  [redacted]w[redacted]d MSN, CNM 04/22/2021 1:16 PM

## 2021-04-22 NOTE — Anesthesia Preprocedure Evaluation (Signed)
Anesthesia Evaluation  Patient identified by MRN, date of birth, ID band Patient awake    Reviewed: Allergy & Precautions, NPO status , Patient's Chart, lab work & pertinent test results  Airway Mallampati: II  TM Distance: >3 FB Neck ROM: Full    Dental no notable dental hx. (+) Teeth Intact   Pulmonary former smoker,    Pulmonary exam normal breath sounds clear to auscultation       Cardiovascular negative cardio ROS Normal cardiovascular exam Rhythm:Regular Rate:Normal     Neuro/Psych PSYCHIATRIC DISORDERS Anxiety negative neurological ROS     GI/Hepatic Neg liver ROS, GERD  Medicated and Controlled,  Endo/Other  negative endocrine ROS  Renal/GU negative Renal ROS  negative genitourinary   Musculoskeletal negative musculoskeletal ROS (+)   Abdominal   Peds negative pediatric ROS (+)  Hematology  (+) anemia , Thrombocytopenia-mild   Anesthesia Other Findings   Reproductive/Obstetrics (+) Pregnancy                               Lab Results  Component Value Date   WBC 10.7 (H) 04/22/2021   HGB 13.5 04/22/2021   HCT 39.9 04/22/2021   MCV 93.4 04/22/2021   PLT 130 (L) 04/22/2021    Anesthesia Physical  Anesthesia Plan  ASA: II  Anesthesia Plan: Epidural   Post-op Pain Management:    Induction: Intravenous  PONV Risk Score and Plan:   Airway Management Planned: Natural Airway  Additional Equipment:   Intra-op Plan:   Post-operative Plan:   Informed Consent: I have reviewed the patients History and Physical, chart, labs and discussed the procedure including the risks, benefits and alternatives for the proposed anesthesia with the patient or authorized representative who has indicated his/her understanding and acceptance.       Plan Discussed with: Anesthesiologist  Anesthesia Plan Comments:         Anesthesia Quick Evaluation

## 2021-04-22 NOTE — Progress Notes (Signed)
Subjective:    Now has epidural with good pain relief.   Objective:    VS: BP 133/87   Pulse 83   Temp 98.2 F (36.8 C) (Oral)   Resp 16   Ht 5\' 4"  (1.626 m)   Wt 71.4 kg   LMP 03/02/2020   SpO2 99%   BMI 27.00 kg/m  FHR : baseline 135 / variability moderate / accelerations present / no decelerations Toco: contractions every 5-6 minutes  Membranes: SROM @ 0450, clear Dilation: 6 Effacement (%): 80 Cervical Position: Posterior Station: 0, Plus 1 Presentation: Vertex Exam by:: [email or web address], CNM   Assessment/Plan:   25 y.o. 25 [redacted]w[redacted]d  Labor: Progressing normally Fetal Wellbeing:  Category I Pain Control:  Epidural Anticipated MOD:  NSVD  [redacted]w[redacted]d MSN, CNM 04/22/2021 11:28 AM

## 2021-04-22 NOTE — MAU Note (Signed)
..  Regina Schwartz is a 25 y.o. at [redacted]w[redacted]d here in MAU reporting: contractions all night that are now every 5-7 minutes. Reports a pop and leaking of fluid since 4:50am. Denies vaginal bleeding. +FM Pain score: 7/10 Vitals:   04/22/21 0546  BP: 122/78  Pulse: 96  Resp: 17  Temp: 97.9 F (36.6 C)  SpO2: 98%     FHT:137

## 2021-04-22 NOTE — Anesthesia Procedure Notes (Signed)
Epidural Patient location during procedure: OB Start time: 04/22/2021 9:38 AM End time: 04/22/2021 9:54 AM  Staffing Anesthesiologist: Lowella Curb, MD Performed: anesthesiologist   Preanesthetic Checklist Completed: patient identified, IV checked, site marked, risks and benefits discussed, surgical consent, monitors and equipment checked, pre-op evaluation and timeout performed  Epidural Patient position: sitting Prep: ChloraPrep Patient monitoring: heart rate, cardiac monitor, continuous pulse ox and blood pressure Approach: midline Location: L2-L3 Injection technique: LOR saline  Needle:  Needle type: Tuohy  Needle gauge: 17 G Needle length: 9 cm Needle insertion depth: 4 cm Catheter type: closed end flexible Catheter size: 20 Guage Catheter at skin depth: 8 cm Test dose: negative  Assessment Events: blood not aspirated, injection not painful, no injection resistance, no paresthesia and negative IV test  Additional Notes Reason for block:procedure for pain

## 2021-04-23 ENCOUNTER — Inpatient Hospital Stay (HOSPITAL_COMMUNITY): Admit: 2021-04-23 | Payer: Self-pay

## 2021-04-23 LAB — CBC
HCT: 33.2 % — ABNORMAL LOW (ref 36.0–46.0)
Hemoglobin: 11.1 g/dL — ABNORMAL LOW (ref 12.0–15.0)
MCH: 31.9 pg (ref 26.0–34.0)
MCHC: 33.4 g/dL (ref 30.0–36.0)
MCV: 95.4 fL (ref 80.0–100.0)
Platelets: 111 10*3/uL — ABNORMAL LOW (ref 150–400)
RBC: 3.48 MIL/uL — ABNORMAL LOW (ref 3.87–5.11)
RDW: 12.5 % (ref 11.5–15.5)
WBC: 14.2 10*3/uL — ABNORMAL HIGH (ref 4.0–10.5)
nRBC: 0 % (ref 0.0–0.2)

## 2021-04-23 LAB — RPR: RPR Ser Ql: NONREACTIVE

## 2021-04-23 MED ORDER — BENZOCAINE-MENTHOL 20-0.5 % EX AERO: 1.0000 "application " | INHALATION_SPRAY | CUTANEOUS | 3 refills | Status: AC | PRN

## 2021-04-23 MED ORDER — IBUPROFEN 600 MG PO TABS: 600.0000 mg | ORAL_TABLET | Freq: Four times a day (QID) | ORAL | 0 refills | Status: AC

## 2021-04-23 MED ORDER — ACETAMINOPHEN 325 MG PO TABS: 650.0000 mg | ORAL_TABLET | ORAL | 3 refills | Status: AC | PRN

## 2021-04-23 NOTE — Social Work (Signed)
CSW received consult for hx of Anxiety and Depression.  CSW met with MOB to offer support and complete assessment.    CSW met with MOB at bedside and introduced CSW role. CSW observed MOB holding the infant and FOB present at bedside. CSW offered MOB privacy. MOB presented calm and welcomed CSW to complete the assessment with FOB present. CSW inquired how MOB has felt since giving birth. MOB reported feeling good with a good L&D experience. CSW inquired how MOB felt emotionally during the pregnancy. MOB reported feeling good with no concerns. CSW inquired about MOB history of anxiety and depression. MOB denied history of anxiety and depression. MOB reported no history of therapy or medication treatment. CSW inquired if MOB experienced PPD. MOB reported no history of PPD. MOB was receptive to the education CSW provided regarding the baby blues period vs. perinatal mood disorders, discussed treatment and gave resources for mental health, if concerns arise.  CSW also recommended MOB complete a self-evaluation during the postpartum time period using the New Mom Checklist from Postpartum Progress and encouraged MOB to contact a medical professional if symptoms are noted at any time. MOB reported if she has concerns, she feels comfortable reaching out to her doctor. MOB identified FOB, mom and five-year-old daughter as supports. MOB shared her older daughter is very excited about the baby.   CSW provided review of Sudden Infant Death Syndrome (SIDS) precautions. MOB reported she has essential items for the infant including a bassinet where the infant will sleep. MOB has chosen [facility] for infant's follow up care. CSW assessed MOB for additional needs. MOB reported no further needs.   CSW identifies no further need for intervention and no barriers to discharge at this time.   Kathrin Greathouse, MSW, LCSW Women[facility] [place] Worker  [phone] 04/23/2021  10:23 AM

## 2021-04-23 NOTE — Discharge Summary (Signed)
Postpartum Discharge Summary  Date of Service updated 04/23/21     Patient Name: Regina Schwartz DOB: [birth year] MRN: [id number]  Date of admission: 04/22/2021 Delivery date:04/22/2021  Delivering provider: Kathalene Frames  Date of discharge: 04/23/2021  Admitting diagnosis: Normal labor and delivery [O80] SVD (spontaneous vaginal delivery) [O80] Intrauterine pregnancy: [redacted]w[redacted]d     Secondary diagnosis:  Principal Problem:   SVD (spontaneous vaginal delivery) Active Problems:   Second degree laceration of perineum, delivered, current hospitalization  Additional problems: none    Discharge diagnosis: Term Pregnancy Delivered                                              Post partum procedures: none Augmentation: N/A Complications: None  Hospital course: Onset of Labor With Vaginal Delivery      25 y.o. yo [id number] at [redacted]w[redacted]d was admitted in Active Labor / SROM on 04/22/2021. Patient had an uncomplicated labor course as follows:  Membrane Rupture Time/Date: 4:50 AM ,04/22/2021   Delivery Method:Vaginal, Spontaneous  Episiotomy: None  Lacerations:  2nd degree  Patient had an uncomplicated postpartum course.  She is ambulating, tolerating a regular diet, passing flatus, and urinating well. Patient is discharged home in stable condition on 04/23/21.  Newborn Data: Birth date:04/22/2021  Birth time:4:22 PM  Gender:Female  Living status:Living  Apgars:9 ,9  Weight:2630 g   Magnesium Sulfate received: No BMZ received: No Rhophylac:No MMR:No T-DaP:Given prenatally Flu: No Transfusion:No  Physical exam  Vitals:   04/22/21 2332 04/23/21 0220 04/23/21 0518 04/23/21 1330  BP: 110/68 116/76 112/81 127/81  Pulse: 74 66 67 77  Resp:  $Remo'16 19 18  'PitjJ$ Temp: 98.3 F (36.8 C) 98 F (36.7 C) 98.4 F (36.9 C) 97.9 F (36.6 C)  TempSrc: Oral Oral  Oral  SpO2: 97%     Weight:      Height:       General: alert, cooperative, and no distress Lochia: appropriate Uterine Fundus:  firm perineum: Healing well with no significant drainage DVT Evaluation: No evidence of DVT seen on physical exam. Negative Homan's sign. No cords or calf tenderness. Labs: Lab Results  Component Value Date   WBC 14.2 (H) 04/23/2021   HGB 11.1 (L) 04/23/2021   HCT 33.2 (L) 04/23/2021   MCV 95.4 04/23/2021   PLT 111 (L) 04/23/2021   CMP Latest Ref Rng & Units 11/09/2020  Glucose 70 - 99 mg/dL 80  BUN 6 - 20 mg/dL 7  Creatinine 0.44 - 1.00 mg/dL 0.46  Sodium 135 - 145 mmol/L 134(L)  Potassium 3.5 - 5.1 mmol/L 3.3(L)  Chloride 98 - 111 mmol/L 105  CO2 22 - 32 mmol/L 19(L)  Calcium 8.9 - 10.3 mg/dL 8.8(L)  Total Protein 6.5 - 8.1 g/dL 6.2(L)  Total Bilirubin 0.3 - 1.2 mg/dL 0.3  Alkaline Phos 38 - 126 U/L 44  AST 15 - 41 U/L 17  ALT 0 - 44 U/L 8   Edinburgh Score: No flowsheet data found.    After visit meds:  Allergies as of 04/23/2021   No Known Allergies      Medication List     TAKE these medications    acetaminophen 325 MG tablet Commonly known as: Tylenol Take 2 tablets (650 mg total) by mouth every 4 (four) hours as needed (for pain scale < 4). What changed:  when to take this  reasons to take this   benzocaine-Menthol 20-0.5 % Aero Commonly known as: DERMOPLAST Apply 1 application topically as needed for irritation (perineal discomfort).   ibuprofen 600 MG tablet Commonly known as: ADVIL Take 1 tablet (600 mg total) by mouth every 6 (six) hours.   prenatal multivitamin Tabs tablet Take 1 tablet by mouth daily at 12 noon.         Discharge home in stable condition Infant Feeding: Breast Infant Disposition:home with mother Discharge instruction: per After Visit Summary and Postpartum booklet. Activity: Advance as tolerated. Pelvic rest for 6 weeks.  Diet: routine diet Anticipated Birth Control: Unsure Postpartum Appointment:6 weeks Additional Postpartum F/U:  none Future Appointments:No future appointments. Follow up Visit:       04/23/2021 Sanjuana Kava, MD

## 2021-04-26 NOTE — Anesthesia Postprocedure Evaluation (Signed)
Anesthesia Post Note  Patient: Regina Schwartz  Procedure(s) Performed: AN AD HOC LABOR EPIDURAL     Patient location during evaluation: L&D Anesthesia Type: Epidural Level of consciousness: awake and alert Pain management: pain level controlled Vital Signs Assessment: post-procedure vital signs reviewed and stable Respiratory status: spontaneous breathing, nonlabored ventilation and respiratory function stable Cardiovascular status: blood pressure returned to baseline and stable Postop Assessment: no apparent nausea or vomiting Anesthetic complications: no   No notable events documented.  Last Vitals:  Vitals:   04/23/21 0518 04/23/21 1330  BP: 112/81 127/81  Pulse: 67 77  Resp: 19 18  Temp: 36.9 C 36.6 C  SpO2:      Last Pain:  Vitals:   04/23/21 1738  TempSrc:   PainSc: 0-No pain   Pain Goal: Patients Stated Pain Goal: 0 (04/22/21 0547)                 Lowella Curb

## 2021-04-30 ENCOUNTER — Inpatient Hospital Stay (HOSPITAL_COMMUNITY)
Admission: AD | Admit: 2021-04-30 | Payer: Medicaid Other | Source: Home / Self Care | Admitting: Obstetrics & Gynecology

## 2021-04-30 ENCOUNTER — Inpatient Hospital Stay (HOSPITAL_COMMUNITY): Payer: Medicaid Other

## 2021-05-04 ENCOUNTER — Telehealth (HOSPITAL_COMMUNITY): Payer: Self-pay | Admitting: *Deleted

## 2021-05-04 NOTE — Telephone Encounter (Signed)
Attempted Hospital Discharge Follow-Up Call.  Left voice mail requesting that patient return RN's phone call.  

## 2024-04-11 ENCOUNTER — Emergency Department (HOSPITAL_BASED_OUTPATIENT_CLINIC_OR_DEPARTMENT_OTHER)
Admission: EM | Admit: 2024-04-11 | Discharge: 2024-04-11 | Disposition: A | Discharge status: Home / Self Care | Attending: Emergency Medicine | Admitting: Emergency Medicine

## 2024-04-11 ENCOUNTER — Emergency Department (HOSPITAL_BASED_OUTPATIENT_CLINIC_OR_DEPARTMENT_OTHER): Admitting: Radiology

## 2024-04-11 ENCOUNTER — Other Ambulatory Visit: Payer: Self-pay

## 2024-04-11 DIAGNOSIS — R079 Chest pain, unspecified: Secondary | ICD-10-CM | POA: Diagnosis present

## 2024-04-11 DIAGNOSIS — F419 Anxiety disorder, unspecified: Secondary | ICD-10-CM | POA: Insufficient documentation

## 2024-04-11 LAB — BASIC METABOLIC PANEL WITH GFR
Anion gap: 14 (ref 5–15)
BUN: 9 mg/dL (ref 6–20)
CO2: 23 mmol/L (ref 22–32)
Calcium: 10.1 mg/dL (ref 8.9–10.3)
Chloride: 101 mmol/L (ref 98–111)
Creatinine, Ser: 0.67 mg/dL (ref 0.44–1.00)
GFR, Estimated: >60 mL/min (ref >60)
Glucose, Bld: 105 mg/dL — ABNORMAL HIGH (ref 70–99)
Potassium: 3.3 mmol/L — ABNORMAL LOW (ref 3.5–5.1)
Sodium: 137 mmol/L (ref 135–145)

## 2024-04-11 LAB — CBC
HCT: 40.5 % (ref 36.0–46.0)
Hemoglobin: 13.5 g/dL (ref 12.0–15.0)
MCH: 31.2 pg (ref 26.0–34.0)
MCHC: 33.3 g/dL (ref 30.0–36.0)
MCV: 93.5 fL (ref 80.0–100.0)
Platelets: 179 K/uL (ref 150–400)
RBC: 4.33 MIL/uL (ref 3.87–5.11)
RDW: 11.8 % (ref 11.5–15.5)
WBC: 6.6 K/uL (ref 4.0–10.5)
nRBC: 0 % (ref 0.0–0.2)

## 2024-04-11 LAB — PREGNANCY, URINE: Preg Test, Ur: NEGATIVE

## 2024-04-11 LAB — TROPONIN T, HIGH SENSITIVITY: Troponin T High Sensitivity: <15 ng/L (ref 0–19)

## 2024-04-11 MED ORDER — HYDROXYZINE HCL 25 MG PO TABS: 25.0000 mg | ORAL_TABLET | Freq: Three times a day (TID) | ORAL | 0 refills | Status: AC | PRN

## 2024-04-11 NOTE — ED Provider Notes (Signed)
 [place] EMERGENCY DEPARTMENT AT [facility] Provider Note   CSN: [id number] Arrival date & time: 04/11/24  1242     Patient presents with: Chest Pain   Regina Schwartz is a 28 y.o. female patient reports that she has been dealing with anxiety and panic for several weeks, she reports that over the last few days it has been worse.  She also endorses that she is starting to have chest discomfort which seems to be across the entire chest and associated with some nausea.  She reports this comes and goes randomly.  She does not have any exertional chest pain shortness of breath or diaphoresis.  No swelling in lower extremities. Chest pain free at this time.  She denies any recent travel, recent surgery.  No history of DVT or PE.  She is not on hormone replacement therapy. No DVT symptoms.     Chest Pain      Prior to Admission medications   Medication Sig Start Date End Date Taking? Authorizing Provider  hydrOXYzine (ATARAX) 25 MG tablet Take 1 tablet (25 mg total) by mouth every 8 (eight) hours as needed for anxiety. 04/11/24  Yes Bates Collington, Warren SAILOR, PA-C  acetaminophen  (TYLENOL ) 325 MG tablet Take 2 tablets (650 mg total) by mouth every 4 (four) hours as needed (for pain scale < 4). 04/23/21   Pinn, Walda, MD  benzocaine -Menthol  (DERMOPLAST) 20-0.5 % AERO Apply 1 application topically as needed for irritation (perineal discomfort). 04/23/21   Pinn, Walda, MD  ibuprofen  (ADVIL ) 600 MG tablet Take 1 tablet (600 mg total) by mouth every 6 (six) hours. 04/23/21   Pinn, Walda, MD  Prenatal Vit-Fe Fumarate-FA (PRENATAL MULTIVITAMIN) TABS tablet Take 1 tablet by mouth daily at 12 noon.    [provider]    Allergies: Patient has no known allergies.    Review of Systems  Cardiovascular:  Positive for chest pain.    Updated Vital Signs BP 131/89 (BP Location: Right Arm)   Pulse 92   Temp 98.2 F (36.8 C) (Oral)   Resp 18   Ht 5' 4 (1.626 m)   Wt 69.4 kg   LMP  03/17/2024 (Exact Date)   SpO2 100%   BMI 26.26 kg/m   Physical Exam  (all labs ordered are listed, but only abnormal results are displayed) Labs Reviewed  BASIC METABOLIC PANEL WITH GFR - Abnormal; Notable for the following components:      Result Value   Potassium 3.3 (*)    Glucose, Bld 105 (*)    All other components within normal limits  CBC  PREGNANCY, URINE  TROPONIN T, HIGH SENSITIVITY  TROPONIN T, HIGH SENSITIVITY    EKG: None  Radiology: DG Chest 2 View Result Date: 04/11/2024 CLINICAL DATA:  Chest pain. EXAM: DG CHEST 2V COMPARISON:  None Available. FINDINGS: The heart size and mediastinal contours are within normal limits. Both lungs are clear. The visualized skeletal structures are unremarkable. IMPRESSION: No active cardiopulmonary disease. Electronically Signed   By: Vanetta Chou M.D.   On: 04/11/2024 14:13     Procedures   Medications Ordered in the ED - No data to display                                  Medical Decision Making Amount and/or Complexity of Data Reviewed Labs: ordered. Radiology: ordered.   This patient presents to the ED for concern of chest pain,  this involves an extensive number of treatment options, and is a complaint that carries with it a high risk of complications and morbidity.  The differential diagnosis includes ACS, stable angina, CHF, pneumonia, pneumothorax, aortic dissection, pulmonary embolus    Co morbidities that complicate the patient evaluation  GAD   Lab Tests:  I personally interpreted labs.  The pertinent results include:   No leukocytosis, BMP shows potassium of 3.3, pregnancy test negative Troponin <15    Imaging Studies ordered:  I ordered imaging studies including chest x-ray I independently visualized and interpreted imaging which showed no acute findings.  I agree with the radiologist interpretation   Cardiac Monitoring: / EKG:  The patient was maintained on a cardiac monitor.  I  personally viewed and interpreted the cardiac monitored which showed an underlying rhythm of: sinus, nonspecific ST changes    Problem List / ED Course / Critical interventions / Medication management  Patient reports to emergency room with complaint of intermittent chest pain.  On arrival she is hemodynamically stable and well-appearing.  Patient's EKG shows normal STEMI.  Her troponin here is within normal limits.  Symptoms are not consistent with ACS.  Symptoms seem less consistent with PE/DVT and she is PERC negative.  She has no sign of fluid overload on exam.  Chest x-ray shows pneumonia or acute cardiopulmonary findings. Blood pressure and vitals stable here. She is currently chest pain free.  Overall reassuring workup here, does endorse that she feels this is being contributed by anxiety and stress.  She denies any SI, HI.  I will give her resources for behavioral health urgent care for further evaluation.  She would like to try hydroxyzine in the meantime. I have reviewed the patients home medicines and have made adjustments as needed. Hemodynamically stable and well-appearing throughout stay feel appropriate for discharge with outpatient follow-up.  Given behavioral urgent care follow-up and trial of hydroxyzine given complaint of anxiety/panic. Denies SI/HI, given return precautions.       Final diagnoses:  Chest pain, unspecified type  Anxiety    ED Discharge Orders          Ordered    hydrOXYzine (ATARAX) 25 MG tablet  Every 8 hours PRN        04/11/24 1544               Charlisa Cham, Warren SAILOR, PA-C 04/11/24 1550    Yolande Lamar BROCKS, MD 04/13/24 1047

## 2024-04-11 NOTE — ED Notes (Signed)

## 2024-04-11 NOTE — ED Triage Notes (Signed)
 Pt POV reporting persistent mid chest pain x3 days, denies SOB, endorses anxiety.

## 2024-04-11 NOTE — Discharge Instructions (Signed)
 Lab work and imaging here is overall reassuring.  I would recommend following up with primary care for recheck of symptoms.  In the meantime I would recommend discontinuing vape/smoking.  I have have sent hydroxyzine to your pharmacy and you can take this as needed for breakthrough anxiety/panic.  You can also follow-up with behavioral health urgent care which is a good resource for follow up.
